# Patient Record
Sex: Male | Born: 1965 | Race: Black or African American | Hispanic: No | Marital: Married | Smoking: Never smoker
Health system: Southern US, Community
[De-identification: ages and names within clinical notes are randomized; demographics above are authoritative.]

## PROBLEM LIST (undated history)

## (undated) DIAGNOSIS — I10 Essential (primary) hypertension: Secondary | ICD-10-CM

## (undated) DIAGNOSIS — M1990 Unspecified osteoarthritis, unspecified site: Secondary | ICD-10-CM

## (undated) DIAGNOSIS — K219 Gastro-esophageal reflux disease without esophagitis: Secondary | ICD-10-CM

## (undated) DIAGNOSIS — Z8489 Family history of other specified conditions: Secondary | ICD-10-CM

## (undated) HISTORY — PX: NO PAST SURGERIES: SHX2092

## (undated) HISTORY — PX: OTHER SURGICAL HISTORY: SHX169

---

## 2004-09-28 ENCOUNTER — Emergency Department: Payer: Self-pay | Admitting: General Practice

## 2009-05-29 ENCOUNTER — Emergency Department: Payer: Self-pay | Admitting: Emergency Medicine

## 2009-09-20 ENCOUNTER — Emergency Department: Payer: Self-pay | Admitting: Unknown Physician Specialty

## 2010-11-02 ENCOUNTER — Emergency Department: Payer: Self-pay | Admitting: Emergency Medicine

## 2011-08-25 ENCOUNTER — Emergency Department: Payer: Self-pay | Admitting: Emergency Medicine

## 2012-03-02 ENCOUNTER — Emergency Department: Payer: Self-pay | Admitting: Emergency Medicine

## 2013-12-20 ENCOUNTER — Emergency Department: Payer: Self-pay | Admitting: Emergency Medicine

## 2013-12-21 LAB — URINALYSIS, COMPLETE
BACTERIA: NONE SEEN
BLOOD: NEGATIVE
Bilirubin,UR: NEGATIVE
GLUCOSE, UR: NEGATIVE mg/dL (ref 0–75)
KETONE: NEGATIVE
Leukocyte Esterase: NEGATIVE
Nitrite: NEGATIVE
PH: 7 (ref 4.5–8.0)
Protein: NEGATIVE
RBC,UR: NONE SEEN /HPF (ref 0–5)
SQUAMOUS EPITHELIAL: NONE SEEN
Specific Gravity: 1.009 (ref 1.003–1.030)
WBC UR: <1 /HPF (ref 0–5)

## 2014-03-23 ENCOUNTER — Emergency Department: Payer: Self-pay | Admitting: Emergency Medicine

## 2014-10-19 ENCOUNTER — Emergency Department: Payer: Self-pay | Admitting: Emergency Medicine

## 2014-11-10 ENCOUNTER — Emergency Department: Payer: Self-pay | Admitting: Emergency Medicine

## 2015-06-02 ENCOUNTER — Emergency Department
Admission: EM | Admit: 2015-06-02 | Discharge: 2015-06-02 | Disposition: A | Discharge status: Home / Self Care | Payer: Self-pay | Attending: Emergency Medicine | Admitting: Emergency Medicine

## 2015-06-02 DIAGNOSIS — Y990 Civilian activity done for income or pay: Secondary | ICD-10-CM | POA: Insufficient documentation

## 2015-06-02 DIAGNOSIS — IMO0001 Reserved for inherently not codable concepts without codable children: Secondary | ICD-10-CM

## 2015-06-02 DIAGNOSIS — X58XXXA Exposure to other specified factors, initial encounter: Secondary | ICD-10-CM | POA: Insufficient documentation

## 2015-06-02 DIAGNOSIS — S66911A Strain of unspecified muscle, fascia and tendon at wrist and hand level, right hand, initial encounter: Secondary | ICD-10-CM | POA: Insufficient documentation

## 2015-06-02 DIAGNOSIS — I10 Essential (primary) hypertension: Secondary | ICD-10-CM | POA: Insufficient documentation

## 2015-06-02 DIAGNOSIS — Y9389 Activity, other specified: Secondary | ICD-10-CM | POA: Insufficient documentation

## 2015-06-02 DIAGNOSIS — Y9289 Other specified places as the place of occurrence of the external cause: Secondary | ICD-10-CM | POA: Insufficient documentation

## 2015-06-02 HISTORY — DX: Essential (primary) hypertension: I10

## 2015-06-02 MED ORDER — MELOXICAM 15 MG PO TABS: 15.0000 mg | ORAL_TABLET | Freq: Every day | ORAL | Status: DC

## 2015-06-02 NOTE — ED Notes (Signed)
REPORTS THAT HE INJURED RIGHT THUMB ONE YEAR AGO AT WORK.  INJURY INCLUDED LIGAMENT INJURY REQUIRING SURGERY TO REPAIR.  RECENTLY RELEASED BACK TO WORK AND FEELS THAT LIGAMENT IS DAMAGED AGAIN.

## 2015-06-02 NOTE — ED Notes (Signed)
Pt is hypertensive in triage,  Pt states that he has a hx of HTN.   He did not take his medications this morning- he states that he ususally takes norvasc and lisinopril

## 2015-06-02 NOTE — Discharge Instructions (Signed)
Cryotherapy °Cryotherapy means treatment with cold. Ice or gel packs can be used to reduce both pain and swelling. Ice is the most helpful within the first 24 to 48 hours after an injury or flare-up from overusing a muscle or joint. Sprains, strains, spasms, burning pain, shooting pain, and aches can all be eased with ice. Ice can also be used when recovering from surgery. Ice is effective, has very few side effects, and is safe for most people to use. °PRECAUTIONS  °Ice is not a safe treatment option for people with: °· Raynaud phenomenon. This is a condition affecting small blood vessels in the extremities. Exposure to cold may cause your problems to return. °· Cold hypersensitivity. There are many forms of cold hypersensitivity, including: °¨ Cold urticaria. Red, itchy hives appear on the skin when the tissues begin to warm after being iced. °¨ Cold erythema. This is a red, itchy rash caused by exposure to cold. °¨ Cold hemoglobinuria. Red blood cells break down when the tissues begin to warm after being iced. The hemoglobin that carry oxygen are passed into the urine because they cannot combine with blood proteins fast enough. °· Numbness or altered sensitivity in the area being iced. °If you have any of the following conditions, do not use ice until you have discussed cryotherapy with your caregiver: °· Heart conditions, such as arrhythmia, angina, or chronic heart disease. °· High blood pressure. °· Healing wounds or open skin in the area being iced. °· Current infections. °· Rheumatoid arthritis. °· Poor circulation. °· Diabetes. °Ice slows the blood flow in the region it is applied. This is beneficial when trying to stop inflamed tissues from spreading irritating chemicals to surrounding tissues. However, if you expose your skin to cold temperatures for too long or without the proper protection, you can damage your skin or nerves. Watch for signs of skin damage due to cold. °HOME CARE INSTRUCTIONS °Follow  these tips to use ice and cold packs safely. °· Place a dry or damp towel between the ice and skin. A damp towel will cool the skin more quickly, so you may need to shorten the time that the ice is used. °· For a more rapid response, add gentle compression to the ice. °· Ice for no more than 10 to 20 minutes at a time. The bonier the area you are icing, the less time it will take to get the benefits of ice. °· Check your skin after 5 minutes to make sure there are no signs of a poor response to cold or skin damage. °· Rest 20 minutes or more between uses. °· Once your skin is numb, you can end your treatment. You can test numbness by very lightly touching your skin. The touch should be so light that you do not see the skin dimple from the pressure of your fingertip. When using ice, most people will feel these normal sensations in this order: cold, burning, aching, and numbness. °· Do not use ice on someone who cannot communicate their responses to pain, such as small children or people with dementia. °HOW TO MAKE AN ICE PACK °Ice packs are the most common way to use ice therapy. Other methods include ice massage, ice baths, and cryosprays. Muscle creams that cause a cold, tingly feeling do not offer the same benefits that ice offers and should not be used as a substitute unless recommended by your caregiver. °To make an ice pack, do one of the following: °· Place crushed ice or a   bag of frozen vegetables in a sealable plastic bag. Squeeze out the excess air. Place this bag inside another plastic bag. Slide the bag into a pillowcase or place a damp towel between your skin and the bag.  Mix 3 parts water with 1 part rubbing alcohol. Freeze the mixture in a sealable plastic bag. When you remove the mixture from the freezer, it will be slushy. Squeeze out the excess air. Place this bag inside another plastic bag. Slide the bag into a pillowcase or place a damp towel between your skin and the bag. SEEK MEDICAL CARE  IF:  You develop white spots on your skin. This may give the skin a blotchy (mottled) appearance.  Your skin turns blue or pale.  Your skin becomes waxy or hard.  Your swelling gets worse. MAKE SURE YOU:   Understand these instructions.  Will watch your condition.  Will get help right away if you are not doing well or get worse. Document Released: 04/27/2011 Document Revised: 01/15/2014 Document Reviewed: 04/27/2011 [facility] Patient Information 2015 [place], [place]. This information is not intended to replace advice given to you by your health care provider. Make sure you discuss any questions you have with your health care provider.  Finger Sprain A finger sprain happens when the bands of tissue that hold the finger bones together (ligaments) stretch too much and tear. HOME CARE  Keep your injured finger raised (elevated) when possible.  Put ice on the injured area, twice a day, for 2 to 3 days.  Put ice in a plastic bag.  Place a towel between your skin and the bag.  Leave the ice on for 15 minutes.  Only take medicine as told by your doctor.  Do not wear rings on the injured finger.  Protect your finger until pain and stiffness go away (usually 3 to 4 weeks).  Do not get your cast or splint to get wet. Cover your cast or splint with a plastic bag when you shower or bathe. Do not swim.  Your doctor may suggest special exercises for you to do. These exercises will help keep or stop stiffness from happening. GET HELP RIGHT AWAY IF:  Your cast or splint gets damaged.  Your pain gets worse, not better. MAKE SURE YOU:  Understand these instructions.  Will watch your condition.  Will get help right away if you are not doing well or get worse. Document Released: 10/03/2010 Document Revised: 11/23/2011 Document Reviewed: 05/04/2011 [facility] Patient Information 2015 [employer], [place]. This information is not intended to replace advice given to you by your health care  provider. Make sure you discuss any questions you have with your health care provider.

## 2015-06-02 NOTE — ED Notes (Signed)
Pt present with c/o pain and burning in right thumb x 2 days,   Hx of ligament repair in the OR at [facility] approx 6 months ago for the same finger.    Pt denies re-injury

## 2015-06-02 NOTE — ED Provider Notes (Signed)
CSN: [id number]     Arrival date & time 06/02/15  1754 History   First MD Initiated Contact with Patient 06/02/15 1900     Chief Complaint  Patient presents with  . Finger Injury     (Consider location/radiation/quality/duration/timing/severity/associated sxs/prior Treatment) HPI  49 year old male presents to the emergency department for evaluation of right thumb pain. Patient has a history of what appears to be on her collateral ligament repair one year ago. Patient states he broke his thumb and then later underwent surgery for ligament repair. Surgery was performed in [place]. Patient denies any recent trauma or injury but notices of last few days he is noticed increased burning pain that is 7 out of 10 in the right thumb. Patient points to the radial collateral ligament as well as the ulnar collateral ligament of the right thumb pain is sharp and increased with activity. He denies any recent trauma or injury. No numbness or tingling.  Past Medical History  Diagnosis Date  . Hypertension    No past surgical history on file. No family history on file. Social History  Substance Use Topics  . Smoking status: Not on file  . Smokeless tobacco: Not on file  . Alcohol Use: Not on file    Review of Systems  Constitutional: Negative.  Negative for fever, chills, activity change and appetite change.  HENT: Negative for congestion, ear pain, mouth sores, rhinorrhea, sinus pressure, sore throat and trouble swallowing.   Eyes: Negative for photophobia, pain and discharge.  Respiratory: Negative for cough, chest tightness and shortness of breath.   Cardiovascular: Negative for chest pain and leg swelling.  Gastrointestinal: Negative for nausea, vomiting, abdominal pain, diarrhea and abdominal distention.  Genitourinary: Negative for dysuria and difficulty urinating.  Musculoskeletal: Positive for arthralgias. Negative for back pain and gait problem.  Skin: Negative for color change and  rash.  Neurological: Negative for dizziness and headaches.  Hematological: Negative for adenopathy.  Psychiatric/Behavioral: Negative for behavioral problems and agitation.      Allergies  Review of patient's allergies indicates no known allergies.  Home Medications   Prior to Admission medications   Medication Sig Start Date End Date Taking? Authorizing Provider  meloxicam (MOBIC) 15 MG tablet Take 1 tablet (15 mg total) by mouth daily. 06/02/15   Duanne Guess, PA-C   BP 192/110 mmHg  Pulse 75  Temp(Src) 98.4 F (36.9 C) (Oral)  Resp 16  Ht 5\' 10"  (1.778 m)  Wt 200 lb (90.719 kg)  BMI 28.70 kg/m2  SpO2 96% Physical Exam  Constitutional: He is oriented to person, place, and time. He appears well-developed and well-nourished.  HENT:  Head: Normocephalic and atraumatic.  Eyes: Conjunctivae and EOM are normal. Pupils are equal, round, and reactive to light.  Neck: Normal range of motion. Neck supple.  Cardiovascular: Normal rate and intact distal pulses.   Pulmonary/Chest: Effort normal. No respiratory distress.  Musculoskeletal:  Examination of the right hand shows the patient has no swelling warmth or erythema. There is incision just over the ulnar collateral ligament of the right thumb. No signs of infection. Patient has mild swelling at the [facility] joint. Negative [facility] grind test. There is no ligamentous laxity with ulnar or radial collateral ligament stress testing at the thumb. Patient has no tendon deficits with flexion or extension of the thumb.  Neurological: He is alert and oriented to person, place, and time.  Skin: Skin is warm and dry.  Psychiatric: He has a normal mood and affect. His  behavior is normal. Judgment and thought content normal.  Nursing note and vitals reviewed.   ED Course  Procedures (including critical care time) SPLINT APPLICATION Date/Time: 1:58 PM Authorized by: Feliberto Gottron Consent: Verbal consent obtained. Risks and benefits:  risks, benefits and alternatives were discussed Consent given by: patient Splint applied by: orthopedic technician Location details: Right hand  Splint type: Ortho-Glass thumb spica  Supplies used: Ace wrap, Ortho-Glass, prewrap  Post-procedure: The splinted body part was neurovascularly unchanged following the procedure. Patient tolerance: Patient tolerated the procedure well with no immediate complications.    Labs Review Labs Reviewed - No data to display  Imaging Review No results found. I have personally reviewed and evaluated these images and lab results as part of my medical decision-making.   EKG Interpretation None      MDM   Final diagnoses:  Strain of thumb, right, initial encounter    49 year old male with acute onset of right thumb pain that is gradually getting worse over the last few days. No signs of infection or gout. He has a history of ulnar collateral ligament repair, repair appears to be stable on exam. He is placed on meloxicam 15 mg daily for 2 weeks. He is given a thumb spica splint to immobilize the thumb joint. He will follow-up with orthopedic surgeon in 5-7 days.    Duanne Guess, PA-C 06/02/15 1912  Earleen Newport, MD 06/02/15 [phone]

## 2015-08-18 ENCOUNTER — Emergency Department: Payer: BLUE CROSS/BLUE SHIELD

## 2015-08-18 ENCOUNTER — Emergency Department
Admission: EM | Admit: 2015-08-18 | Discharge: 2015-08-18 | Disposition: A | Discharge status: Home / Self Care | Payer: BLUE CROSS/BLUE SHIELD | Attending: Emergency Medicine | Admitting: Emergency Medicine

## 2015-08-18 ENCOUNTER — Encounter: Payer: Self-pay | Admitting: Emergency Medicine

## 2015-08-18 DIAGNOSIS — M545 Low back pain, unspecified: Secondary | ICD-10-CM

## 2015-08-18 DIAGNOSIS — I10 Essential (primary) hypertension: Secondary | ICD-10-CM | POA: Insufficient documentation

## 2015-08-18 DIAGNOSIS — Z791 Long term (current) use of non-steroidal anti-inflammatories (NSAID): Secondary | ICD-10-CM | POA: Diagnosis not present

## 2015-08-18 DIAGNOSIS — M25551 Pain in right hip: Secondary | ICD-10-CM | POA: Diagnosis present

## 2015-08-18 MED ORDER — LISINOPRIL 10 MG PO TABS: 10.0000 mg | ORAL_TABLET | Freq: Two times a day (BID) | ORAL | Status: DC

## 2015-08-18 MED ORDER — AMLODIPINE BESYLATE 5 MG PO TABS: 5.0000 mg | ORAL_TABLET | Freq: Every day | ORAL | Status: DC

## 2015-08-18 MED ORDER — PREDNISONE 20 MG PO TABS
60.0000 mg | ORAL_TABLET | Freq: Once | ORAL | Status: AC
  Administered 2015-08-18: 60 mg via ORAL

## 2015-08-18 MED ORDER — PREDNISONE 20 MG PO TABS: ORAL_TABLET | ORAL | Status: DC

## 2015-08-18 MED ORDER — PREDNISONE 20 MG PO TABS
ORAL_TABLET | ORAL | Status: AC
  Administered 2015-08-18: 60 mg via ORAL
  Filled 2015-08-18: qty 3

## 2015-08-18 NOTE — Discharge Instructions (Signed)
Your ultrasound did not show a blood clot. You have the symptoms of a lower back issue with radiculopathy. Take prednisone as prescribed. You may also take ibuprofen if needed. If so, take it with food. Follow-up with a regular doctor for ongoing care. Return to the emergency department if you have urgent concerns.  Back Pain, Adult Back pain is very common. The pain often gets better over time. The cause of back pain is usually not dangerous. Most people can learn to manage their back pain on their own.  HOME CARE  Watch your back pain for any changes. The following actions may help to lessen any pain you are feeling:  Stay active. Start with short walks on flat ground if you can. Try to walk farther each day.  Exercise regularly as told by your doctor. Exercise helps your back heal faster. It also helps avoid future injury by keeping your muscles strong and flexible.  Do not sit, drive, or stand in one place for more than 30 minutes.  Do not stay in bed. Resting more than 1-2 days can slow down your recovery.  Be careful when you bend or lift an object. Use good form when lifting:  Bend at your knees.  Keep the object close to your body.  Do not twist.  Sleep on a firm mattress. Lie on your side, and bend your knees. If you lie on your back, put a pillow under your knees.  Take medicines only as told by your doctor.  Put ice on the injured area.  Put ice in a plastic bag.  Place a towel between your skin and the bag.  Leave the ice on for 20 minutes, 2-3 times a day for the first 2-3 days. After that, you can switch between ice and heat packs.  Avoid feeling anxious or stressed. Find good ways to deal with stress, such as exercise.  Maintain a healthy weight. Extra weight puts stress on your back. GET HELP IF:   You have pain that does not go away with rest or medicine.  You have worsening pain that goes down into your legs or buttocks.  You have pain that does not  get better in one week.  You have pain at night.  You lose weight.  You have a fever or chills. GET HELP RIGHT AWAY IF:   You cannot control when you poop (bowel movement) or pee (urinate).  Your arms or legs feel weak.  Your arms or legs lose feeling (numbness).  You feel sick to your stomach (nauseous) or throw up (vomit).  You have belly (abdominal) pain.  You feel like you may pass out (faint).   This information is not intended to replace advice given to you by your health care provider. Make sure you discuss any questions you have with your health care provider.   Document Released: 02/17/2008 Document Revised: 09/21/2014 Document Reviewed: 01/02/2014 Elsevier Interactive Patient Education [employer].

## 2015-08-18 NOTE — ED Notes (Signed)
Pt in [place] at this time 

## 2015-08-18 NOTE — ED Provider Notes (Signed)
[facility] Emergency Department Provider Note  ____________________________________________  Time seen: 35  I have reviewed the triage vital signs and the nursing notes.  History by:  Patient  HISTORY  Chief Complaint Sciatica  pain radiating from right hip to right knee    HPI Robert Suarez is a 49 y.o. male who presents with discomfort from his right hip to his right knee. He says it feels like it is throbbing sometimes. He has some difficulty with ambulation and increased pain with movement. He reports he may have had symptoms like this once before a few years ago, but is rather vague about this. The symptoms have been present for 3-4 days. He denies any numbness or paresthesia.   After the initial interview and physical exam, the patient tells me that blood clots run in his family. He reports to family members die because of blood clots. He tells me that one family member went to the doctor with a leg issue and was sent away. He appears anxious and nervous about this possibility for a DVT.    Past Medical History  Diagnosis Date  . Hypertension     There are no active problems to display for this patient.   History reviewed. No pertinent past surgical history.  Current Outpatient Rx  Name  Route  Sig  Dispense  Refill  . amLODipine (NORVASC) 5 MG tablet   Oral   Take 1 tablet (5 mg total) by mouth daily.   30 tablet   1   . lisinopril (PRINIVIL,ZESTRIL) 10 MG tablet   Oral   Take 1 tablet (10 mg total) by mouth 2 (two) times daily.   30 tablet   1   . meloxicam (MOBIC) 15 MG tablet   Oral   Take 1 tablet (15 mg total) by mouth daily.   14 tablet   0   . predniSONE (DELTASONE) 20 MG tablet      Take 2 tablets on the first and second day. Take 1 tablet on the third through sixth day.   7 tablet   0     Allergies Review of patient's allergies indicates no known allergies.  History reviewed. No pertinent family  history.  Social History Social History  Substance Use Topics  . Smoking status: Never Smoker   . Smokeless tobacco: Never Used  . Alcohol Use: No    Review of Systems  Constitutional: Negative for fever/chills. ENT: Negative for congestion. Cardiovascular: Negative for chest pain. Respiratory: Negative for cough. Gastrointestinal: Negative for abdominal pain, vomiting and diarrhea. Genitourinary: Negative for dysuria. Musculoskeletal: Discomfort from right hip to right knee. See history of present illness Skin: Negative for rash. Neurological: Negative for headache or focal weakness   10-point ROS otherwise negative.  ____________________________________________   PHYSICAL EXAM:  VITAL SIGNS: ED Triage Vitals  Enc Vitals Group     BP 08/18/15 0638 149/98 mmHg     Pulse Rate 08/18/15 0638 72     Resp 08/18/15 0638 18     Temp 08/18/15 0638 98.3 F (36.8 C)     Temp Source 08/18/15 0638 Oral     SpO2 08/18/15 0638 96 %     Weight 08/18/15 0638 200 lb (90.719 kg)     Height 08/18/15 0638 5\' 10"  (1.778 m)     Head Cir --      Peak Flow --      Pain Score 08/18/15 0639 7     Pain Loc --  Pain Edu? --      Excl. in [place]? --     Constitutional:  Alert and oriented. Well appearing and in no distress. Some discomfort with ambulation. ENT   Head: Normocephalic and atraumatic.   Nose: No congestion/rhinnorhea.  Cardiovascular: Normal rate, regular rhythm, no murmur noted Respiratory:  Normal respiratory effort, no tachypnea.    Breath sounds are clear and equal bilaterally.  Gastrointestinal: Soft, no distention. Nontender Back: No muscle spasm, no tenderness, no CVA tenderness. Patient has some discomfort in the right leg with straight leg raise. Musculoskeletal: No deformity noted. Nontender with normal range of motion in all extremities.  No noted edema. Calves are equal in size. Pulses present bilaterally. Neurologic:  Communicative.  No gross focal  neurologic deficits are appreciated.  Intact sensation in all extremities. Skin:  Skin is warm, dry. No rash noted. Psychiatric: Mood and affect are normal. Speech and behavior are normal.  ____________________________________________     RADIOLOGY  Doppler ultrasound right leg:   IMPRESSION: No evidence of right lower extremity deep venous thrombosis. Superficial soft tissues of the right lower extremity are unremarkable.   ____________________________________________  ____________________________________________   INITIAL IMPRESSION / ASSESSMENT AND PLAN / ED COURSE  Pertinent labs & imaging results that were available during my care of the patient were reviewed by me and considered in my medical decision making (see chart for details).  Overall well-appearing 49 year old male with some discomfort with ambulation with signs and symptoms that are consistent with a back issue possible nerve impingement. I believe the appropriate treatment for him is the use of steroids to reduce inflammation and to have him reassessed as an outpatient. The patient had not mentioned anything about a family history of DVTs initially. Clinically he does not appear to have a DVT. Catheter are of equal size. The symptoms are consistent with a nerve impingement from above. Given his anxiety and family history we will obtain a Doppler ultrasound to evaluate for low likely DVT.  ----------------------------------------- 10:13 AM on 08/18/2015 -----------------------------------------  It took a while to get the DVT completed, but it has returned and is negative.  I will discharge patient with a prednisone taper as initially planned. I've asked him to follow-up with his primary physician.  ____________________________________________   FINAL CLINICAL IMPRESSION(S) / ED DIAGNOSES  Final diagnoses:  Right-sided low back pain without sciatica      Ahmed Prima, MD 08/18/15 1019

## 2015-08-18 NOTE — ED Notes (Signed)
Pt c/o pain that radiates from right buttock down the back of his leg to about his knee; pt denies history of similar pain; ambulatory without difficulty;

## 2015-08-18 NOTE — ED Notes (Signed)
NAD noted at time of D/C. Pt denies questions or concerns. Pt ambulatory to the lobby at this time. MD made aware of pt's BP. This RN instructed patient to take BP medication when he got home as he had not taken his dose today.

## 2016-02-13 ENCOUNTER — Other Ambulatory Visit: Payer: Self-pay | Admitting: Student

## 2016-02-13 DIAGNOSIS — M25561 Pain in right knee: Secondary | ICD-10-CM

## 2016-03-06 ENCOUNTER — Ambulatory Visit: Payer: BLUE CROSS/BLUE SHIELD

## 2016-05-11 ENCOUNTER — Other Ambulatory Visit: Payer: Self-pay

## 2016-05-11 ENCOUNTER — Telehealth: Payer: Self-pay

## 2016-05-11 NOTE — Telephone Encounter (Signed)
Gastroenterology Pre-Procedure Review  Request Date: 06/11/2016 Requesting Physician:   PATIENT REVIEW QUESTIONS: The patient responded to the following health history questions as indicated:    1. Are you having any GI issues? no 2. Do you have a personal history of Polyps? no 3. Do you have a family history of Colon Cancer or Polyps? yes (father polyps, benign) 4. Diabetes Mellitus? no 5. Joint replacements in the past 12 months?no 6. Major health problems in the past 3 months?no 7. Any artificial heart valves, MVP, or defibrillator?no    MEDICATIONS & ALLERGIES:    Patient reports the following regarding taking any anticoagulation/antiplatelet therapy:   Plavix, Coumadin, Eliquis, Xarelto, Lovenox, Pradaxa, Brilinta, or Effient? no Aspirin? no  Patient confirms/reports the following medications:  Current Outpatient Prescriptions  Medication Sig Dispense Refill  . amLODipine (NORVASC) 5 MG tablet Take 1 tablet (5 mg total) by mouth daily. 30 tablet 1  . lisinopril (PRINIVIL,ZESTRIL) 10 MG tablet Take 1 tablet (10 mg total) by mouth 2 (two) times daily. 30 tablet 1   No current facility-administered medications for this visit.     Patient confirms/reports the following allergies:  No Known Allergies  No orders of the defined types were placed in this encounter.   AUTHORIZATION INFORMATION Primary Insurance: 1D#: Group #:  Secondary Insurance: 1D#: Group #:  SCHEDULE INFORMATION: Date: 06/11/2016 Time: Location: [facility]

## 2016-05-11 NOTE — Telephone Encounter (Signed)
Screening Colonoscopy Z12.11 [facility] [id number] Please pre cert

## 2016-05-28 ENCOUNTER — Encounter: Payer: Self-pay | Admitting: *Deleted

## 2016-05-29 ENCOUNTER — Encounter: Payer: Self-pay | Admitting: Student in an Organized Health Care Education/Training Program

## 2016-05-29 ENCOUNTER — Other Ambulatory Visit: Payer: Self-pay

## 2016-05-29 DIAGNOSIS — I10 Essential (primary) hypertension: Secondary | ICD-10-CM | POA: Insufficient documentation

## 2016-06-02 ENCOUNTER — Other Ambulatory Visit: Payer: Self-pay

## 2016-06-02 ENCOUNTER — Telehealth: Payer: Self-pay

## 2016-06-03 NOTE — Telephone Encounter (Signed)
Pt had to move colonoscopy appt to Nov 7th.

## 2016-06-04 ENCOUNTER — Ambulatory Visit
Admission: RE | Admit: 2016-06-04 | Payer: BLUE CROSS/BLUE SHIELD | Source: Ambulatory Visit | Admitting: Gastroenterology

## 2016-06-04 HISTORY — DX: Family history of other specified conditions: Z84.89

## 2016-06-04 SURGERY — COLONOSCOPY WITH PROPOFOL
Anesthesia: General

## 2016-07-20 ENCOUNTER — Telehealth: Payer: Self-pay | Admitting: Gastroenterology

## 2016-07-20 NOTE — Telephone Encounter (Signed)
Patient calling back to reschedule his colonoscopy

## 2016-07-21 ENCOUNTER — Ambulatory Visit
Admission: RE | Admit: 2016-07-21 | Payer: BLUE CROSS/BLUE SHIELD | Source: Ambulatory Visit | Admitting: Gastroenterology

## 2016-07-21 ENCOUNTER — Other Ambulatory Visit: Payer: Self-pay

## 2016-07-21 ENCOUNTER — Encounter: Admission: RE | Payer: Self-pay | Source: Ambulatory Visit

## 2016-07-21 SURGERY — COLONOSCOPY WITH PROPOFOL
Anesthesia: General

## 2016-07-21 NOTE — Telephone Encounter (Signed)
Rescheduled for 07/30/16 [facility] [address] Dr. Vicente Males

## 2016-07-21 NOTE — Telephone Encounter (Signed)
Tried calling patient. Voicemail has not been set up yet. No message was left

## 2016-07-30 ENCOUNTER — Encounter
Admission: RE | Disposition: A | Discharge status: Home / Self Care | Payer: Self-pay | Source: Ambulatory Visit | Attending: Gastroenterology

## 2016-07-30 ENCOUNTER — Ambulatory Visit
Admission: RE | Admit: 2016-07-30 | Discharge: 2016-07-30 | Disposition: A | Discharge status: Home / Self Care | Payer: BLUE CROSS/BLUE SHIELD | Source: Ambulatory Visit | Attending: Gastroenterology | Admitting: Gastroenterology

## 2016-07-30 ENCOUNTER — Ambulatory Visit: Payer: BLUE CROSS/BLUE SHIELD | Admitting: Anesthesiology

## 2016-07-30 DIAGNOSIS — D127 Benign neoplasm of rectosigmoid junction: Secondary | ICD-10-CM | POA: Diagnosis not present

## 2016-07-30 DIAGNOSIS — E119 Type 2 diabetes mellitus without complications: Secondary | ICD-10-CM | POA: Insufficient documentation

## 2016-07-30 DIAGNOSIS — K621 Rectal polyp: Secondary | ICD-10-CM | POA: Diagnosis not present

## 2016-07-30 DIAGNOSIS — Z79899 Other long term (current) drug therapy: Secondary | ICD-10-CM | POA: Insufficient documentation

## 2016-07-30 DIAGNOSIS — K640 First degree hemorrhoids: Secondary | ICD-10-CM | POA: Insufficient documentation

## 2016-07-30 DIAGNOSIS — I10 Essential (primary) hypertension: Secondary | ICD-10-CM | POA: Diagnosis not present

## 2016-07-30 DIAGNOSIS — M1990 Unspecified osteoarthritis, unspecified site: Secondary | ICD-10-CM | POA: Diagnosis not present

## 2016-07-30 DIAGNOSIS — Z1211 Encounter for screening for malignant neoplasm of colon: Secondary | ICD-10-CM | POA: Diagnosis not present

## 2016-07-30 HISTORY — DX: Unspecified osteoarthritis, unspecified site: M19.90

## 2016-07-30 HISTORY — PX: COLONOSCOPY WITH PROPOFOL: SHX5780

## 2016-07-30 SURGERY — COLONOSCOPY WITH PROPOFOL
Anesthesia: General

## 2016-07-30 MED ORDER — SODIUM CHLORIDE 0.9 % IV SOLN
INTRAVENOUS | Status: DC | PRN
  Administered 2016-07-30: 11:00:00 via INTRAVENOUS

## 2016-07-30 MED ORDER — MIDAZOLAM HCL 2 MG/2ML IJ SOLN
INTRAMUSCULAR | Status: DC | PRN
  Administered 2016-07-30: 2 mg via INTRAVENOUS

## 2016-07-30 MED ORDER — LIDOCAINE HCL (CARDIAC) 20 MG/ML IV SOLN
INTRAVENOUS | Status: DC | PRN
  Administered 2016-07-30: 80 mg via INTRAVENOUS

## 2016-07-30 MED ORDER — PROPOFOL 500 MG/50ML IV EMUL
INTRAVENOUS | Status: DC | PRN
  Administered 2016-07-30: 150 ug/kg/min via INTRAVENOUS

## 2016-07-30 MED ORDER — PROPOFOL 10 MG/ML IV BOLUS
INTRAVENOUS | Status: DC | PRN
  Administered 2016-07-30: 80 mg via INTRAVENOUS
  Administered 2016-07-30: 20 mg via INTRAVENOUS

## 2016-07-30 NOTE — Op Note (Signed)
[facility] Gastroenterology Patient Name: Robert Suarez Procedure Date: 07/30/2016 12:03 PM MRN: [id number] Account #: [id number] Date of Birth: [birth year] Admit Type: Outpatient Age: 50 Room: [facility] ENDO ROOM 4 Gender: Male Note Status: Finalized Procedure:            Colonoscopy Indications:          Screening for colorectal malignant neoplasm Providers:            Jonathon Bellows MD, MD Referring MD:         No Local Md, MD (Referring MD) Medicines:            Monitored Anesthesia Care Complications:        No immediate complications. Procedure:            Pre-Anesthesia Assessment:                       - ASA Grade Assessment: II - A patient with mild                        systemic disease.                       - Prior to the procedure, a History and Physical was                        performed, and patient medications, allergies and                        sensitivities were reviewed. The patient's tolerance of                        previous anesthesia was reviewed.                       - The risks and benefits of the procedure and the                        sedation options and risks were discussed with the                        patient. All questions were answered and informed                        consent was obtained.                       After obtaining informed consent, the colonoscope was                        passed under direct vision. Throughout the procedure,                        the patient's blood pressure, pulse, and oxygen                        saturations were monitored continuously. The                        Colonoscope was introduced through the anus and  advanced to the the cecum, identified by the                        appendiceal orifice, IC valve and transillumination.                        The colonoscopy was performed with ease. The patient                        tolerated the procedure well. The quality  of the bowel                        preparation was excellent. Findings:      The perianal and digital rectal examinations were normal.      Non-bleeding internal hemorrhoids were found during retroflexion. The       hemorrhoids were small and Grade I (internal hemorrhoids that do not       prolapse).      A 3 mm polyp was found in the recto-sigmoid colon. The polyp was       sessile. The polyp was removed with a cold biopsy forceps. Resection and       retrieval were complete. Impression:           - Non-bleeding internal hemorrhoids.                       - One 3 mm polyp at the recto-sigmoid colon, removed                        with a cold biopsy forceps. Resected and retrieved. Recommendation:       - Patient has a contact number available for                        emergencies. The signs and symptoms of potential                        delayed complications were discussed with the patient.                        Return to normal activities tomorrow. Written discharge                        instructions were provided to the patient.                       - Resume previous diet.                       - Await pathology results.                       - Repeat colonoscopy for surveillance based on                        pathology results.                       - Discharge patient to home (with escort). Procedure Code(s):    --- Professional ---  45380, Colonoscopy, flexible; with biopsy, single or                        multiple Diagnosis Code(s):    --- Professional ---                       Z12.11, Encounter for screening for malignant neoplasm                        of colon                       D12.7, Benign neoplasm of rectosigmoid junction                       K64.0, First degree hemorrhoids CPT copyright 2016 American Medical Association. All rights reserved. The codes documented in this report are preliminary and upon coder review may  be revised to  meet current compliance requirements. Jonathon Bellows, MD Jonathon Bellows MD, MD 07/30/2016 12:26:15 PM This report has been signed electronically. Number of Addenda: 0 Note Initiated On: 07/30/2016 12:03 PM Scope Withdrawal Time: 0 hours 14 minutes 43 seconds  Total Procedure Duration: 0 hours 16 minutes 57 seconds       [facility]

## 2016-07-30 NOTE — H&P (Signed)
  Jonathon Bellows MD [address], [place], [place] [address] Phone: [phone] Fax : [phone]  Primary Care Physician:  No PCP Per Patient Primary Gastroenterologist:  Dr. Jonathon Bellows   Pre-Procedure History & Physical: HPI:  Robert Suarez is a 50 y.o. male is here for an colonoscopy.   Past Medical History:  Diagnosis Date  . Arthritis    right knee  . Family history of adverse reaction to anesthesia    son dx'd with malignant hyperthermia 3 yrs ago (after jaw surgery)  . Hypertension     Past Surgical History:  Procedure Laterality Date  . ligament repair Right   . NO PAST SURGERIES      Prior to Admission medications   Medication Sig Start Date End Date Taking? Authorizing Provider  amLODipine (NORVASC) 5 MG tablet Take 1 tablet (5 mg total) by mouth daily. 08/18/15   Ahmed Prima, MD  etodolac (LODINE) 500 MG tablet Take by mouth. 10/02/15   Historical Provider, MD  lisinopril (PRINIVIL,ZESTRIL) 10 MG tablet Take 1 tablet (10 mg total) by mouth 2 (two) times daily. 08/18/15   Ahmed Prima, MD  predniSONE (STERAPRED UNI-PAK 21 TAB) 10 MG (21) TBPK tablet 6 day taper. Take as directed with food 01/21/16   Historical Provider, MD    Allergies as of 07/21/2016  . (No Known Allergies)    History reviewed. No pertinent family history.  Social History   Social History  . Marital status: Married    Spouse name: N/A  . Number of children: N/A  . Years of education: N/A   Occupational History  . Not on file.   Social History Main Topics  . Smoking status: Never Smoker  . Smokeless tobacco: Never Used  . Alcohol use No  . Drug use: No  . Sexual activity: Not on file   Other Topics Concern  . Not on file   Social History Narrative  . No narrative on file    Review of Systems: See HPI, otherwise negative ROS  Physical Exam: BP (!) 141/92   Pulse 79   Temp 98.7 F (37.1 C) (Oral)   Resp 16   Ht 5\' 10"  (1.778 m)   Wt 200 lb (90.7 kg)   SpO2  99%   BMI 28.70 kg/m  General:   Alert,  pleasant and cooperative in NAD Head:  Normocephalic and atraumatic. Neck:  Supple; no masses or thyromegaly. Lungs:  Clear throughout to auscultation.    Heart:  Regular rate and rhythm. Abdomen:  Soft, nontender and nondistended. Normal bowel sounds, without guarding, and without rebound.   Neurologic:  Alert and  oriented x4;  grossly normal neurologically.  Impression/Plan: Robert Suarez is here for an colonoscopy to be performed for colorectal cancer screening   Risks, benefits, limitations, and alternatives regarding  colonoscopy have been reviewed with the patient.  Questions have been answered.  All parties agreeable.   Jonathon Bellows, MD  07/30/2016, 11:31 AM

## 2016-07-30 NOTE — Anesthesia Postprocedure Evaluation (Signed)
Anesthesia Post Note  Patient: Robert Suarez  Procedure(s) Performed: Procedure(s) (LRB): COLONOSCOPY WITH PROPOFOL (N/A)  Patient location during evaluation: Endoscopy Anesthesia Type: General Level of consciousness: awake and alert Pain management: pain level controlled Vital Signs Assessment: post-procedure vital signs reviewed and stable Respiratory status: spontaneous breathing, nonlabored ventilation and respiratory function stable Cardiovascular status: blood pressure returned to baseline and stable Postop Assessment: no signs of nausea or vomiting Anesthetic complications: no    Last Vitals:  Vitals:   07/30/16 1048 07/30/16 1227  BP: (!) 141/92 117/87  Pulse: 79 81  Resp: 16 18  Temp: 37.1 C 36.1 C    Last Pain:  Vitals:   07/30/16 1227  TempSrc: Tympanic                 Faisal Stradling

## 2016-07-30 NOTE — Transfer of Care (Signed)
Immediate Anesthesia Transfer of Care Note  Patient: Tatsuya Marana  Procedure(s) Performed: Procedure(s): COLONOSCOPY WITH PROPOFOL (N/A)  Patient Location: PACU  Anesthesia Type:General  Level of Consciousness: sedated  Airway & Oxygen Therapy: Patient Spontanous Breathing and Patient connected to nasal cannula oxygen  Post-op Assessment: Report given to RN and Post -op Vital signs reviewed and stable  Post vital signs: Reviewed and stable  Last Vitals:  Vitals:   07/30/16 1048 07/30/16 1227  BP: (!) 141/92 117/87  Pulse: 79 81  Resp: 16 18  Temp: 37.1 C     Last Pain:  Vitals:   07/30/16 1048  TempSrc: Oral         Complications: No apparent anesthesia complications

## 2016-07-30 NOTE — Anesthesia Preprocedure Evaluation (Signed)
Anesthesia Evaluation  Patient identified by MRN, date of birth, ID band Patient awake    Reviewed: Allergy & Precautions, NPO status , Patient's Chart, lab work & pertinent test results  History of Anesthesia Complications (+) Family history of anesthesia reactionHistory of anesthetic complications: son has malignant hyperthermia, patient has never had anesthesia and has not been tested.  Airway Mallampati: II  TM Distance: >3 FB Neck ROM: Full    Dental no notable dental hx.    Pulmonary neg pulmonary ROS, neg sleep apnea, neg COPD,    breath sounds clear to auscultation- rhonchi (-) wheezing      Cardiovascular hypertension, Pt. on medications (-) CAD and (-) Past MI  Rhythm:Regular Rate:Normal - Systolic murmurs and - Diastolic murmurs    Neuro/Psych negative neurological ROS  negative psych ROS   GI/Hepatic negative GI ROS, Neg liver ROS,   Endo/Other  negative endocrine ROSneg diabetes  Renal/GU negative Renal ROS     Musculoskeletal  (+) Arthritis ,   Abdominal (+) - obese,   Peds  Hematology negative hematology ROS (+)   Anesthesia Other Findings Past Medical History: No date: Arthritis     Comment: right knee No date: Family history of adverse reaction to anesthes*     Comment: son dx'd with malignant hyperthermia 3 yrs ago              (after jaw surgery) No date: Hypertension   Reproductive/Obstetrics                             Anesthesia Physical Anesthesia Plan  ASA: II  Anesthesia Plan: General   Post-op Pain Management:    Induction: Intravenous  Airway Management Planned: Natural Airway  Additional Equipment:   Intra-op Plan:   Post-operative Plan:   Informed Consent: I have reviewed the patients History and Physical, chart, labs and discussed the procedure including the risks, benefits and alternatives for the proposed anesthesia with the patient or  authorized representative who has indicated his/her understanding and acceptance.   Dental advisory given  Plan Discussed with: CRNA and Anesthesiologist  Anesthesia Plan Comments:         Anesthesia Quick Evaluation

## 2016-07-30 NOTE — Anesthesia Procedure Notes (Signed)
Performed by: Camilo Mander Pre-anesthesia Checklist: Patient identified, Emergency Drugs available, Suction available, Patient being monitored and Timeout performed Patient Re-evaluated:Patient Re-evaluated prior to inductionOxygen Delivery Method: Nasal cannula Intubation Type: IV induction       

## 2016-07-31 LAB — SURGICAL PATHOLOGY

## 2016-08-03 ENCOUNTER — Telehealth: Payer: Self-pay | Admitting: Gastroenterology

## 2016-08-03 NOTE — Telephone Encounter (Signed)
Pt was wondering what he is supposed to do next. He had a colonoscopy on 07/30/16. Please advise

## 2016-08-04 NOTE — Telephone Encounter (Signed)
Advised pt results are available but needs review. Will contact once Dr. Vicente Males advises.

## 2016-08-05 NOTE — Telephone Encounter (Signed)
Patient called upset because no one has contacted him pertaining to his colonoscopy report. Patient had the colonoscopy done on 07/30/2016. Ginger documented yesterday that we need to have doctor Vicente Males review over the results and then advice his nurse on what needs to be done next. Patient does not understand this. I explained to the patient that the results need to be reviewed by a physician. A nurse is not able to review and then treat the patient on their own means. The patient got really angry and began to talk about not having time to wait around for the doctor. I apologized for that but once again explained that there is nothing I can do until the doctor reviews over his results. Patient then responded with "if the nurse contacts me, then she contacts me. If not, then not, bye" and hung up the phone.

## 2016-08-13 ENCOUNTER — Telehealth: Payer: Self-pay

## 2016-08-13 NOTE — Telephone Encounter (Signed)
-----   Message from Jonathon Bellows, MD sent at 08/10/2016  3:10 PM EST ----- Repeat in 10 years colonoscopy

## 2016-08-13 NOTE — Telephone Encounter (Signed)
Pt's wife notified of colonoscopy results.

## 2017-01-20 ENCOUNTER — Emergency Department: Payer: BLUE CROSS/BLUE SHIELD

## 2017-01-20 ENCOUNTER — Encounter: Payer: Self-pay | Admitting: Emergency Medicine

## 2017-01-20 ENCOUNTER — Emergency Department
Admission: EM | Admit: 2017-01-20 | Discharge: 2017-01-20 | Disposition: A | Discharge status: Home / Self Care | Payer: BLUE CROSS/BLUE SHIELD | Attending: Emergency Medicine | Admitting: Emergency Medicine

## 2017-01-20 DIAGNOSIS — M79602 Pain in left arm: Secondary | ICD-10-CM | POA: Insufficient documentation

## 2017-01-20 DIAGNOSIS — Z760 Encounter for issue of repeat prescription: Secondary | ICD-10-CM | POA: Diagnosis not present

## 2017-01-20 DIAGNOSIS — I10 Essential (primary) hypertension: Secondary | ICD-10-CM | POA: Insufficient documentation

## 2017-01-20 DIAGNOSIS — Z79899 Other long term (current) drug therapy: Secondary | ICD-10-CM | POA: Insufficient documentation

## 2017-01-20 MED ORDER — LISINOPRIL 10 MG PO TABS: 10.0000 mg | ORAL_TABLET | Freq: Every day | ORAL | 11 refills | Status: DC

## 2017-01-20 MED ORDER — MELOXICAM 15 MG PO TABS: 15.0000 mg | ORAL_TABLET | Freq: Every day | ORAL | 2 refills | Status: DC

## 2017-01-20 MED ORDER — AMLODIPINE BESYLATE 5 MG PO TABS: 5.0000 mg | ORAL_TABLET | Freq: Every day | ORAL | 11 refills | Status: DC

## 2017-01-20 NOTE — ED Triage Notes (Signed)
Pt to ed with c/o left arm pain worse with movement and palpation since yesterday. Pt denies new injury, but pt with noted decreased ROM in left shoulder and left arm. Pt denies chest pain, denies sob, denies neck pain, denies back pain.

## 2017-01-20 NOTE — ED Provider Notes (Signed)
[facility] Emergency Department Provider Note   ____________________________________________   First MD Initiated Contact with Patient 01/20/17 1434     (approximate)  I have reviewed the triage vital signs and the nursing notes.   HISTORY  Chief Complaint Arm Pain   HPI Robert Suarez is a 51 y.o. male is complaining of left shoulder and left elbow pain increased with movement. Patient states gradual onset over month worsened just today. Patient denies any injury. Patient stated pain increases with abduction and opiate reaching of the shoulder. Patient to elbow pain increases with supination of the extremity. Patient denies loss of sensation or loss of strength of the upper extremity. Patient rates pain as a 7/10. Patient described a pain as "achy". No palliative measures for complaint. Patient state no blood pressure medicine 2 weeks. Patient state has appointment with PCP if the month.   Past Medical History:  Diagnosis Date  . Arthritis    right knee  . Family history of adverse reaction to anesthesia    son dx'd with malignant hyperthermia 3 yrs ago (after jaw surgery)  . Hypertension     Patient Active Problem List   Diagnosis Date Noted  . HTN (hypertension) 05/29/2016    Past Surgical History:  Procedure Laterality Date  . COLONOSCOPY WITH PROPOFOL N/A 07/30/2016   Procedure: COLONOSCOPY WITH PROPOFOL;  Surgeon: Jonathon Bellows, MD;  Location: [facility] ENDOSCOPY;  Service: Endoscopy;  Laterality: N/A;  . ligament repair Right   . NO PAST SURGERIES      Prior to Admission medications   Medication Sig Start Date End Date Taking? Authorizing Provider  amLODipine (NORVASC) 5 MG tablet Take 1 tablet (5 mg total) by mouth daily. 08/18/15   Ahmed Prima, MD  amLODipine (NORVASC) 5 MG tablet Take 1 tablet (5 mg total) by mouth daily. 01/20/17 01/20/18  Sable Feil, PA-C  etodolac (LODINE) 500 MG tablet Take by mouth. 10/02/15   [provider]  lisinopril (PRINIVIL,ZESTRIL) 10 MG tablet Take 1 tablet (10 mg total) by mouth 2 (two) times daily. 08/18/15   Ahmed Prima, MD  lisinopril (PRINIVIL,ZESTRIL) 10 MG tablet Take 1 tablet (10 mg total) by mouth daily. 01/20/17 01/20/18  Sable Feil, PA-C  meloxicam (MOBIC) 15 MG tablet Take 1 tablet (15 mg total) by mouth daily. 01/20/17   Sable Feil, PA-C  predniSONE (STERAPRED UNI-PAK 21 TAB) 10 MG (21) TBPK tablet 6 day taper. Take as directed with food 01/21/16   [provider]    Allergies Patient has no known allergies.  No family history on file.  Social History Social History  Substance Use Topics  . Smoking status: Never Smoker  . Smokeless tobacco: Never Used  . Alcohol use No    Review of Systems  Constitutional: No fever/chills Eyes: No visual changes. ENT: No sore throat. Cardiovascular: Denies chest pain. Respiratory: Denies shortness of breath. Gastrointestinal: No abdominal pain.  No nausea, no vomiting.  No diarrhea.  No constipation. Genitourinary: Negative for dysuria. Musculoskeletal: Negative for back pain. Skin: Negative for rash. Neurological: Negative for headaches, focal weakness or numbness. Endocrine:Hypertension  ____________________________________________   PHYSICAL EXAM:  VITAL SIGNS: ED Triage Vitals [01/20/17 1402]  Enc Vitals Group     BP (!) 161/100     Pulse Rate 81     Resp 20     Temp 97.2 F (36.2 C)     Temp Source Oral     SpO2 99 %  Weight 200 lb (90.7 kg)     Height      Head Circumference      Peak Flow      Pain Score 7     Pain Loc      Pain Edu?      Excl. in [place]?     Constitutional: Alert and oriented. Well appearing and in no acute distress. Eyes: Conjunctivae are normal. PERRL. EOMI. Head: Atraumatic. Nose: No congestion/rhinnorhea. Mouth/Throat: Mucous membranes are moist.  Oropharynx non-erythematous. Neck: No stridor.  No cervical spine tenderness to  palpation. Hematological/Lymphatic/Immunilogical: No cervical lymphadenopathy. Cardiovascular: Normal rate, regular rhythm. Grossly normal heart sounds.  Good peripheral circulation. We'll get a blood pressure Respiratory: Normal respiratory effort.  No retractions. Lungs CTAB. Gastrointestinal: Soft and nontender. No distention. No abdominal bruits. No CVA tenderness. Musculoskeletal: No obvious deformity to the left upper extremity.  Neurologic:  Normal speech and language. No gross focal neurologic deficits are appreciated. No gait instability. Skin:  Skin is warm, dry and intact. No rash noted. Psychiatric: Mood and affect are normal. Speech and behavior are normal.  ____________________________________________   LABS (all labs ordered are listed, but only abnormal results are displayed)  Labs Reviewed - No data to display ____________________________________________  EKG   ____________________________________________  RADIOLOGY  No acute findings on x-ray. ____________________________________________   PROCEDURES  Procedure(s) performed: None  Procedures  Critical Care performed: No  ____________________________________________   INITIAL IMPRESSION / ASSESSMENT AND PLAN / ED COURSE  Pertinent labs & imaging results that were available during my care of the patient were reviewed by me and considered in my medical decision making (see chart for details).  Arthralgia secondary to early arthritis. Medication refill for hypertension.     ____________________________________________   FINAL CLINICAL IMPRESSION(S) / ED DIAGNOSES  Final diagnoses:  Left arm pain  Encounter for medication refill      NEW MEDICATIONS STARTED DURING THIS VISIT:  New Prescriptions   AMLODIPINE (NORVASC) 5 MG TABLET    Take 1 tablet (5 mg total) by mouth daily.   LISINOPRIL (PRINIVIL,ZESTRIL) 10 MG TABLET    Take 1 tablet (10 mg total) by mouth daily.   MELOXICAM (MOBIC) 15  MG TABLET    Take 1 tablet (15 mg total) by mouth daily.     Note:  This document was prepared using Dragon voice recognition software and may include unintentional dictation errors.    Sable Feil, PA-C 01/20/17 1538    Earleen Newport, MD 01/21/17 [phone]

## 2017-01-20 NOTE — ED Notes (Addendum)
Pt reports that he is having left arm pain - he states when he woke up his arm was feeling weak and that this has been getting worse over the last week - pt states he has also been voiding frequently

## 2018-01-17 ENCOUNTER — Emergency Department
Admission: EM | Admit: 2018-01-17 | Discharge: 2018-01-17 | Disposition: A | Discharge status: Home / Self Care | Payer: BLUE CROSS/BLUE SHIELD | Attending: Emergency Medicine | Admitting: Emergency Medicine

## 2018-01-17 ENCOUNTER — Other Ambulatory Visit: Payer: Self-pay

## 2018-01-17 DIAGNOSIS — Z9114 Patient's other noncompliance with medication regimen: Secondary | ICD-10-CM | POA: Diagnosis not present

## 2018-01-17 DIAGNOSIS — I10 Essential (primary) hypertension: Secondary | ICD-10-CM | POA: Diagnosis not present

## 2018-01-17 DIAGNOSIS — Z9119 Patient's noncompliance with other medical treatment and regimen: Secondary | ICD-10-CM

## 2018-01-17 DIAGNOSIS — J069 Acute upper respiratory infection, unspecified: Secondary | ICD-10-CM

## 2018-01-17 DIAGNOSIS — Z79899 Other long term (current) drug therapy: Secondary | ICD-10-CM | POA: Diagnosis not present

## 2018-01-17 DIAGNOSIS — Z91199 Patient's noncompliance with other medical treatment and regimen due to unspecified reason: Secondary | ICD-10-CM

## 2018-01-17 DIAGNOSIS — J029 Acute pharyngitis, unspecified: Secondary | ICD-10-CM | POA: Diagnosis present

## 2018-01-17 LAB — GROUP A STREP BY PCR: Group A Strep by PCR: NOT DETECTED

## 2018-01-17 MED ORDER — AMLODIPINE BESYLATE 5 MG PO TABS: 5.0000 mg | ORAL_TABLET | Freq: Every day | ORAL | 1 refills | Status: DC

## 2018-01-17 MED ORDER — CETIRIZINE-PSEUDOEPHEDRINE ER 5-120 MG PO TB12: 1.0000 | ORAL_TABLET | Freq: Two times a day (BID) | ORAL | 0 refills | Status: DC

## 2018-01-17 MED ORDER — LISINOPRIL 10 MG PO TABS: 10.0000 mg | ORAL_TABLET | Freq: Two times a day (BID) | ORAL | 1 refills | Status: DC

## 2018-01-17 MED ORDER — PSEUDOEPH-BROMPHEN-DM 30-2-10 MG/5ML PO SYRP: 5.0000 mL | ORAL_SOLUTION | Freq: Four times a day (QID) | ORAL | 0 refills | Status: DC | PRN

## 2018-01-17 NOTE — Discharge Instructions (Signed)
You need to call make an appointment for complete physical and for further care to include your hypertension at 1 of the clinics listed above.  Call today to make an appointment as your appointment might be 1 month out.  There are multiple clinics listed on your discharge papers for you to call.  You will need constant management of your hypertension as well as a complete physical.  You may also obtain Flonase nasal spray over-the-counter for your symptoms.

## 2018-01-17 NOTE — ED Notes (Signed)
See triage note. Presents with sinus pressure and headache  States sx's started yesterday  No fever or cough at present

## 2018-01-17 NOTE — ED Provider Notes (Signed)
[facility] Emergency Department Provider Note  ____________________________________________   First MD Initiated Contact with Patient 01/17/18 [id number]     (approximate)  I have reviewed the triage vital signs and the nursing notes.   HISTORY  Chief Complaint Sore Throat and Nasal Congestion   HPI Robert Suarez is a 52 y.o. male is here with complaint of sore throat, runny nose and cough that started yesterday.  Patient has not taken any over-the-counter medication for his symptoms.  He states nasal drainage is clear but sometimes yellow.  He denies any fever, chills, nausea or vomiting.  Patient also is here requesting refills of his blood pressure medication as he is not gotten a PCP since last year.  He states he always comes to the emergency department for his blood pressure medicine occasion.   Past Medical History:  Diagnosis Date  . Arthritis    right knee  . Family history of adverse reaction to anesthesia    son dx'd with malignant hyperthermia 3 yrs ago (after jaw surgery)  . Hypertension     Patient Active Problem List   Diagnosis Date Noted  . HTN (hypertension) 05/29/2016    Past Surgical History:  Procedure Laterality Date  . COLONOSCOPY WITH PROPOFOL N/A 07/30/2016   Procedure: COLONOSCOPY WITH PROPOFOL;  Surgeon: Jonathon Bellows, MD;  Location: [facility] ENDOSCOPY;  Service: Endoscopy;  Laterality: N/A;  . ligament repair Right   . NO PAST SURGERIES      Prior to Admission medications   Medication Sig Start Date End Date Taking? Authorizing Provider  amLODipine (NORVASC) 5 MG tablet Take 1 tablet (5 mg total) by mouth daily. 01/17/18 01/17/19  Johnn Hai, PA-C  brompheniramine-pseudoephedrine-DM 30-2-10 MG/5ML syrup Take 5 mLs by mouth 4 (four) times daily as needed. 01/17/18   Johnn Hai, PA-C  lisinopril (PRINIVIL,ZESTRIL) 10 MG tablet Take 1 tablet (10 mg total) by mouth 2 (two) times daily. 01/17/18   Johnn Hai, PA-C     Allergies Patient has no known allergies.  No family history on file.  Social History Social History   Tobacco Use  . Smoking status: Never Smoker  . Smokeless tobacco: Never Used  Substance Use Topics  . Alcohol use: No  . Drug use: No    Review of Systems Constitutional: No fever/chills Cardiovascular: Denies chest pain. Respiratory: Denies shortness of breath. Gastrointestinal:  No nausea, no vomiting.   Musculoskeletal: Negative for muscle aches. Skin: Negative for rash. Neurological: Negative for headaches, focal weakness or numbness. ___________________________________________   PHYSICAL EXAM:  VITAL SIGNS: ED Triage Vitals [01/17/18 0212]  Enc Vitals Group     BP (!) 134/96     Pulse Rate 85     Resp 16     Temp 98.3 F (36.8 C)     Temp Source Oral     SpO2 96 %     Weight 200 lb (90.7 kg)     Height 5\' 10"  (1.778 m)     Head Circumference      Peak Flow      Pain Score 7     Pain Loc      Pain Edu?      Excl. in [place]?    Constitutional: Alert and oriented. Well appearing and in no acute distress. Eyes: Conjunctivae are normal.  Head: Atraumatic. Nose: Mild congestion/rhinnorhea.  TMs are dull bilaterally. Mouth/Throat: Mucous membranes are moist.  Oropharynx non-erythematous. Neck: No stridor.   Hematological/Lymphatic/Immunilogical: No cervical lymphadenopathy.  Cardiovascular: Normal rate, regular rhythm. Grossly normal heart sounds.  Good peripheral circulation. Respiratory: Normal respiratory effort.  No retractions. Lungs CTAB. Musculoskeletal: Moves upper and lower extremities without any difficulty.  Normal gait was noted. Neurologic:  Normal speech and language. No gross focal neurologic deficits are appreciated.  Skin:  Skin is warm, dry and intact. No rash noted. Psychiatric: Mood and affect are normal. Speech and behavior are normal.  ____________________________________________   LABS (all labs ordered are listed, but only  abnormal results are displayed)  Labs Reviewed  GROUP A STREP BY PCR    PROCEDURES  Procedure(s) performed: None  Procedures  Critical Care performed: No  ____________________________________________   INITIAL IMPRESSION / ASSESSMENT AND PLAN / ED COURSE  As part of my medical decision making, I reviewed the following data within the electronic MEDICAL RECORD NUMBER Notes from prior ED visits and [place] Controlled Substance Database  Patient is here with complaint of upper wrist Tory symptoms that have been less than 24 hours.  He also is here for refill of his blood pressure medication as he has been noncompliant and finding a doctor for over a year.  Patient was given a prescription for Bromfed-DM as needed for cough and congestion.  He was also given a 30-day supply of his blood pressure medications with 1 refill.  He was given a list of clinics to begin calling to establish a PCP.  He states that he was seeing [facility] until he moved to [place] and this became inconvenient.  ____________________________________________   FINAL CLINICAL IMPRESSION(S) / ED DIAGNOSES  Final diagnoses:  Upper respiratory tract infection, unspecified type  Patient noncompliance  Hypertension, unspecified type     ED Discharge Orders        Ordered    cetirizine-pseudoephedrine (ZYRTEC-D ALLERGY & CONGESTION) 5-120 MG tablet  2 times daily,   Status:  Discontinued     01/17/18 0718    amLODipine (NORVASC) 5 MG tablet  Daily     01/17/18 0729    lisinopril (PRINIVIL,ZESTRIL) 10 MG tablet  2 times daily     01/17/18 0729    brompheniramine-pseudoephedrine-DM 30-2-10 MG/5ML syrup  4 times daily PRN     01/17/18 0729       Note:  This document was prepared using Dragon voice recognition software and may include unintentional dictation errors.    Johnn Hai, PA-C 01/17/18 0932    Harvest Dark, MD 01/17/18 [phone]

## 2018-01-17 NOTE — ED Triage Notes (Signed)
Pt states sore throat and runny nose since yesterday. Pt states nasal drainage is clear and yellow. Pt appears in no acute distress, unlabored resps.

## 2018-06-16 IMAGING — CR DG ELBOW COMPLETE 3+V*L*
1 series · 4 of 4 positions shown · non-contrast
Comparison: None in PACs

CLINICAL DATA: Pain in the proximal humerus laterally extending
into the elbow. The pain is made worse with movement. No acute or
old injury.

EXAM:
LEFT ELBOW - COMPLETE 3+ VIEW

[Series 1: dg elbow complete left (3+view) · 0.14mm/px · 4 of 4 slices shown]
[im 1/4]
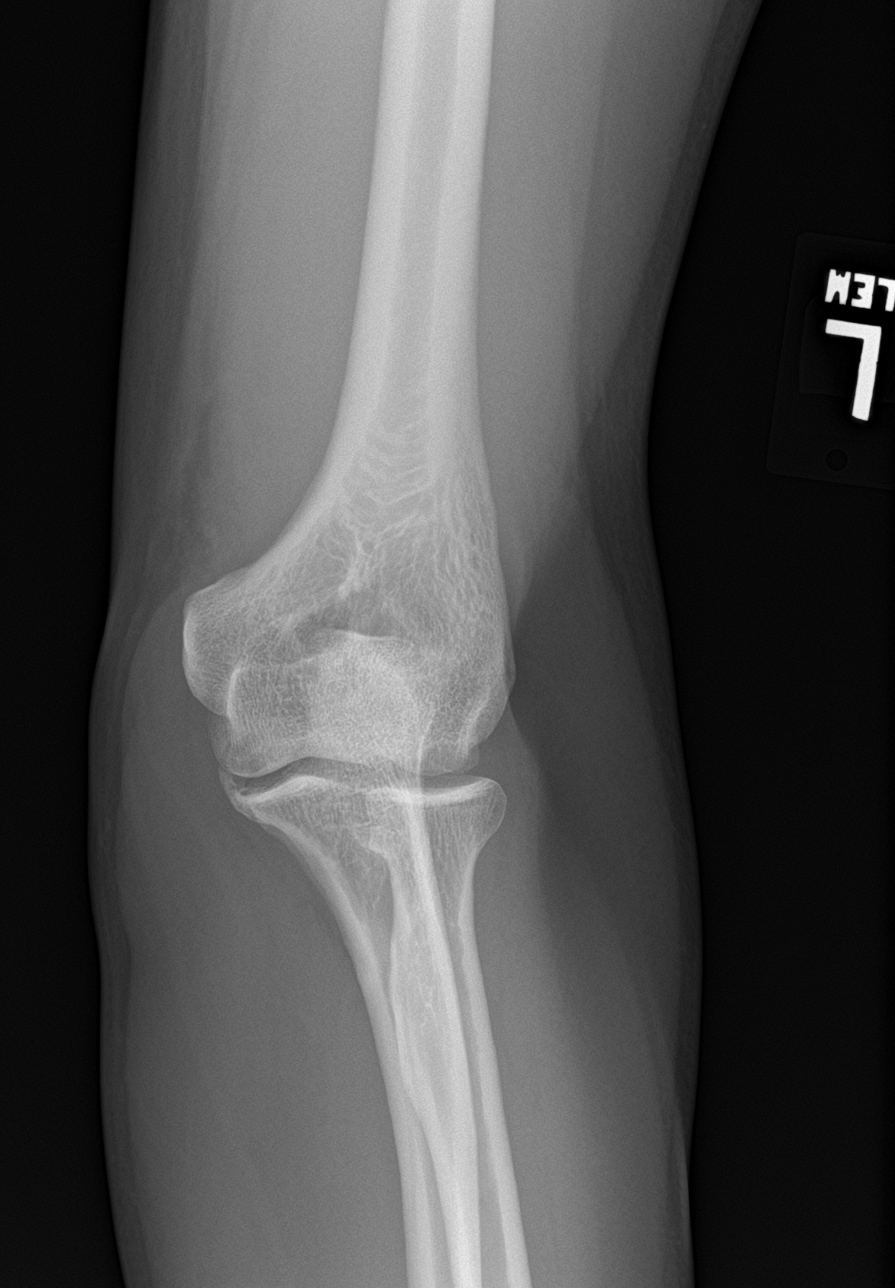
[im 2/4]
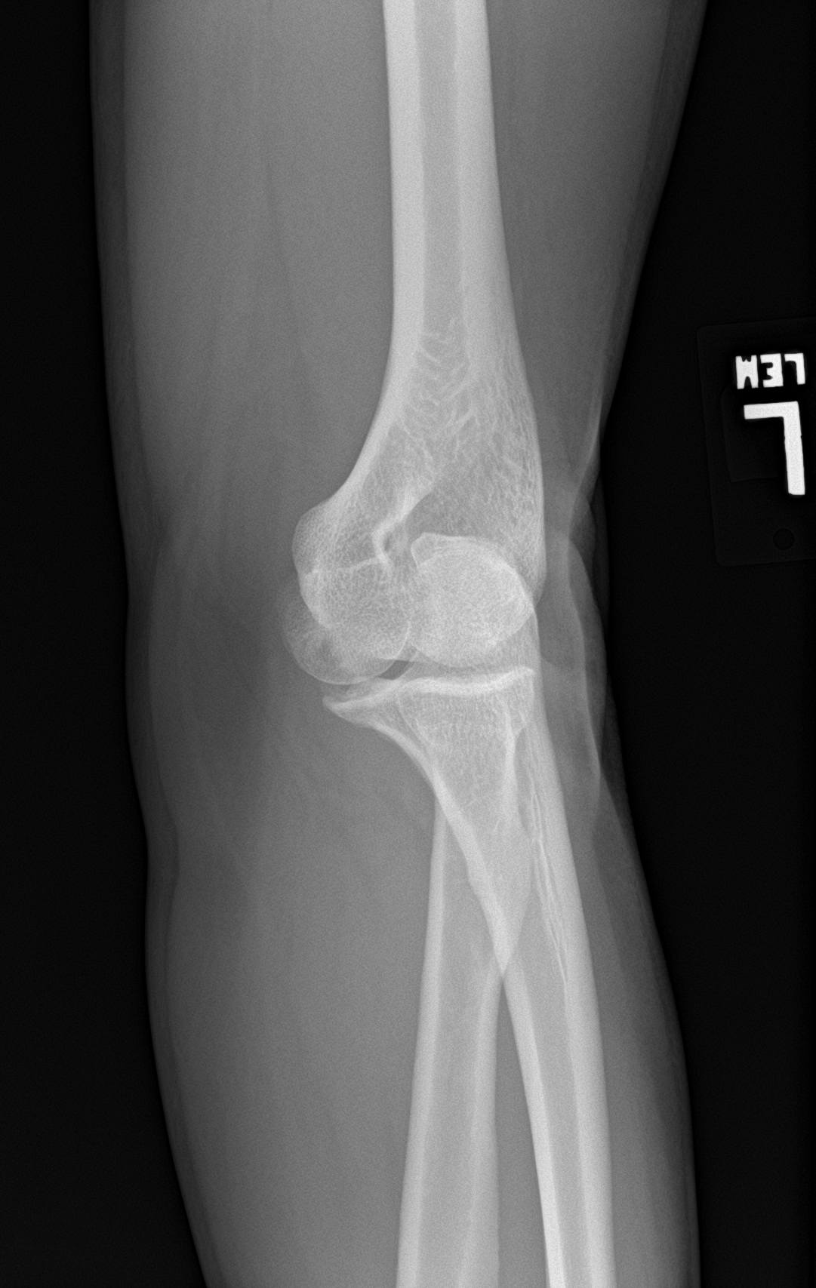
[im 3/4]
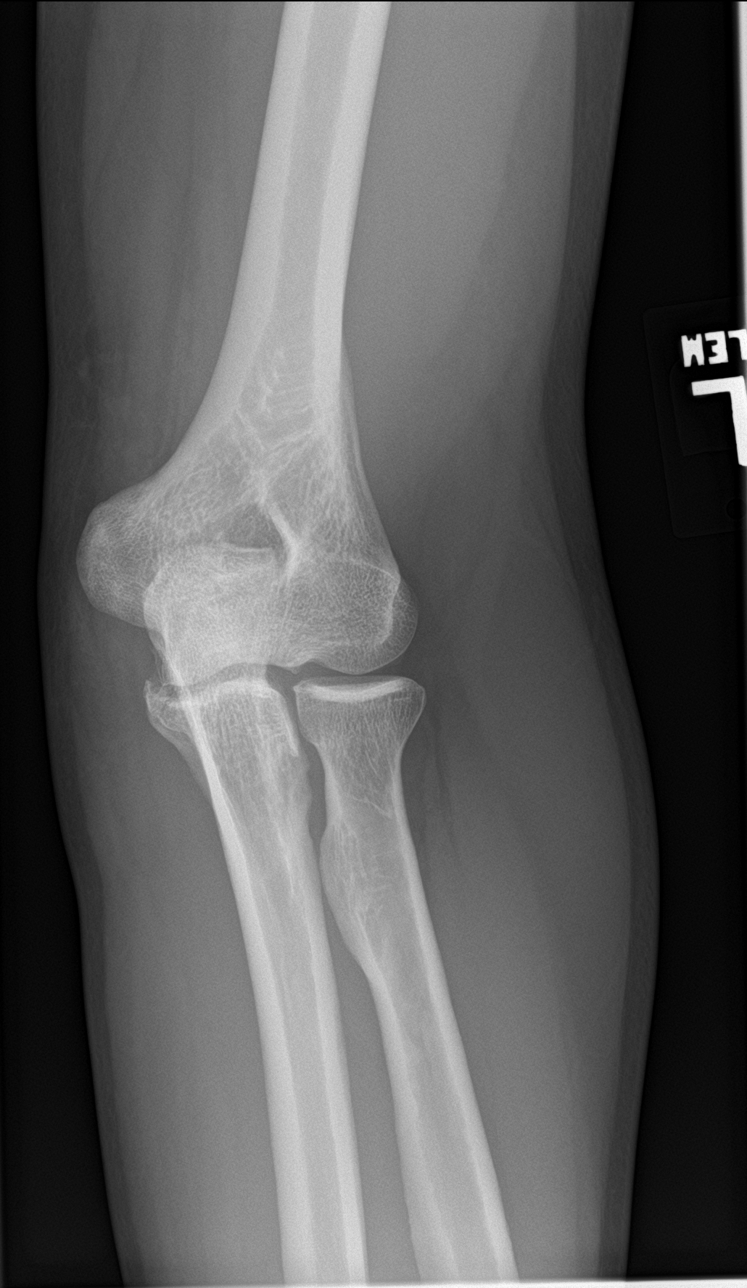
[im 4/4]
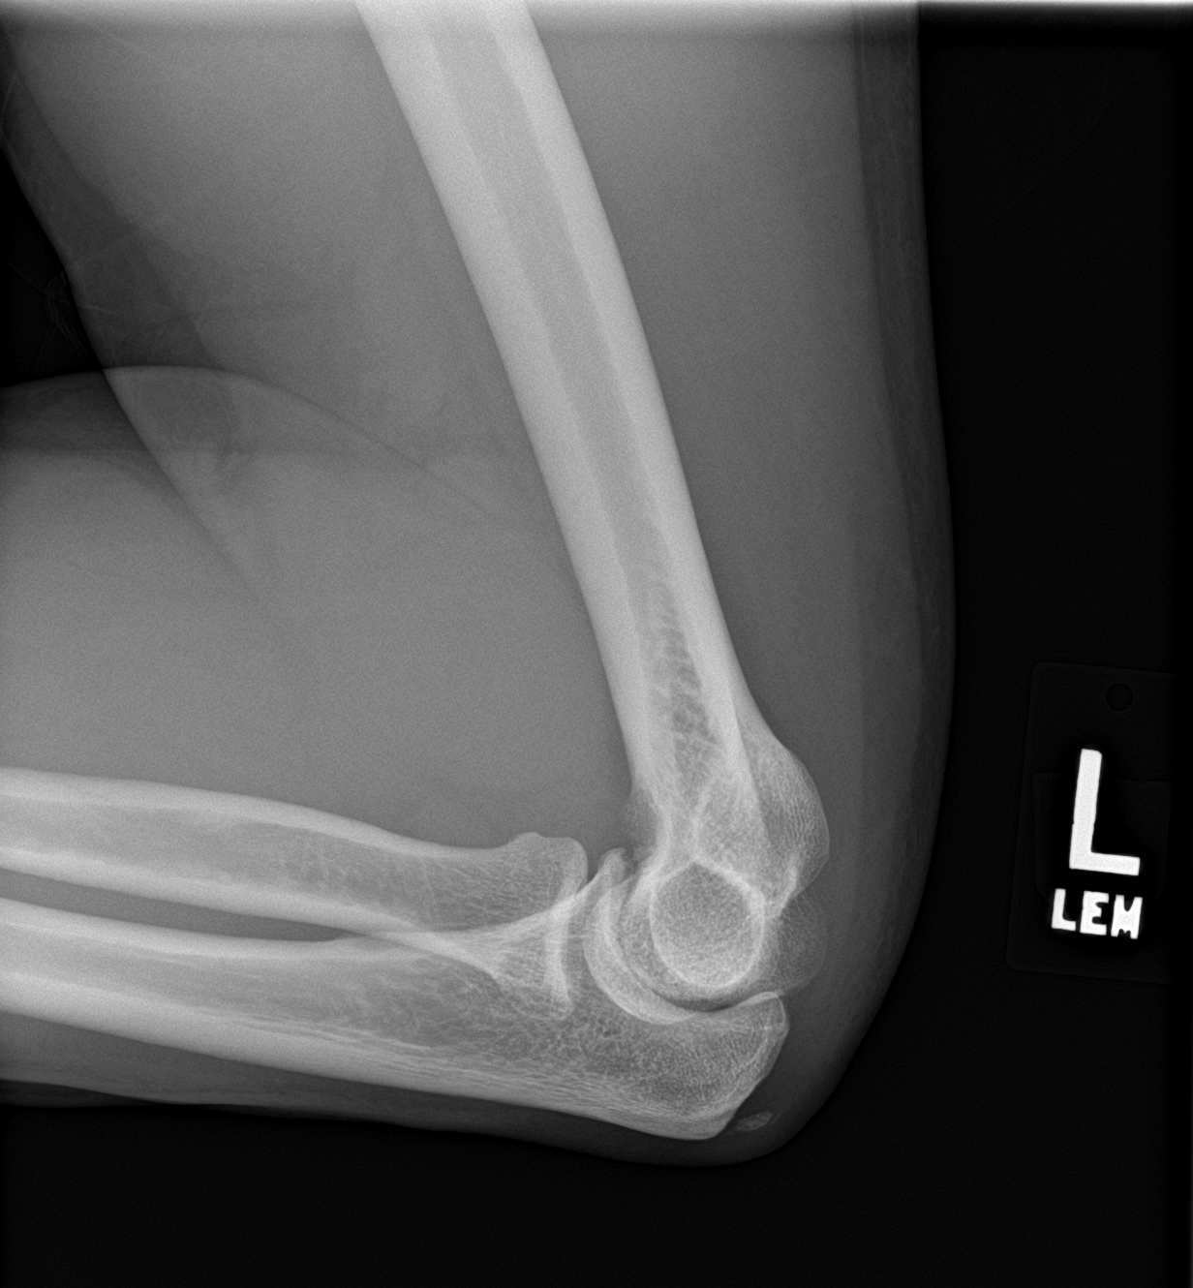

[4 of 4 positions shown; findings below may reference images not displayed]

FINDINGS: The bones are subjectively adequately mineralized. The radial head
is intact as is the olecranon. Small spurs arise from the coronoid
process of the olecranon. The condylar and supracondylar regions of
the humerus are normal. There is a small olecranon spur. There is no
significant joint effusion.
IMPRESSION: Minimal osteoarthritic changes of the left elbow. There is a small
olecranon spur. No acute bony abnormality.

## 2018-06-16 IMAGING — CR DG SHOULDER 2+V*L*
1 series · 3 of 3 positions shown · non-contrast
Comparison: None.

CLINICAL DATA: Pain and decreased range of motion

EXAM:
LEFT SHOULDER - 2+ VIEW

[Series 1: dg shoulder left · 0.14mm/px · 3 of 3 slices shown]
[im 1/3]
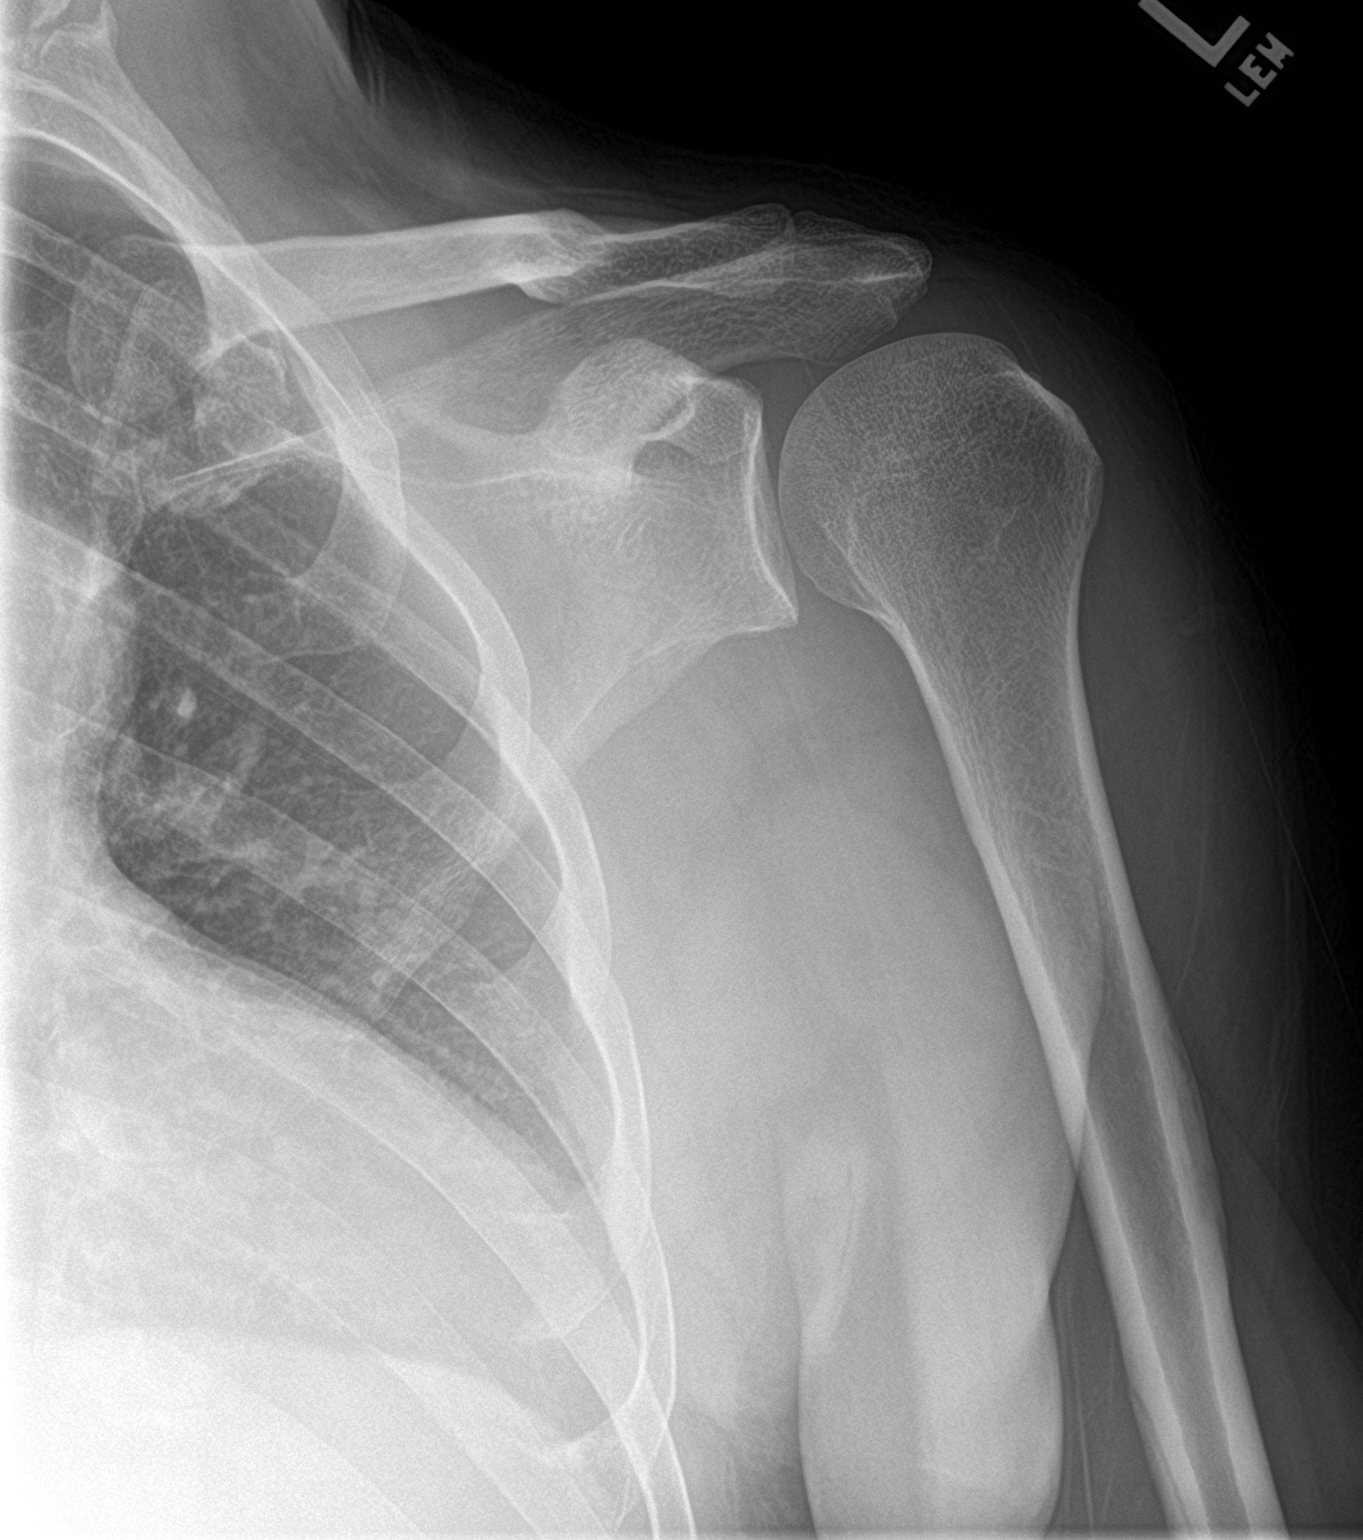
[im 2/3]
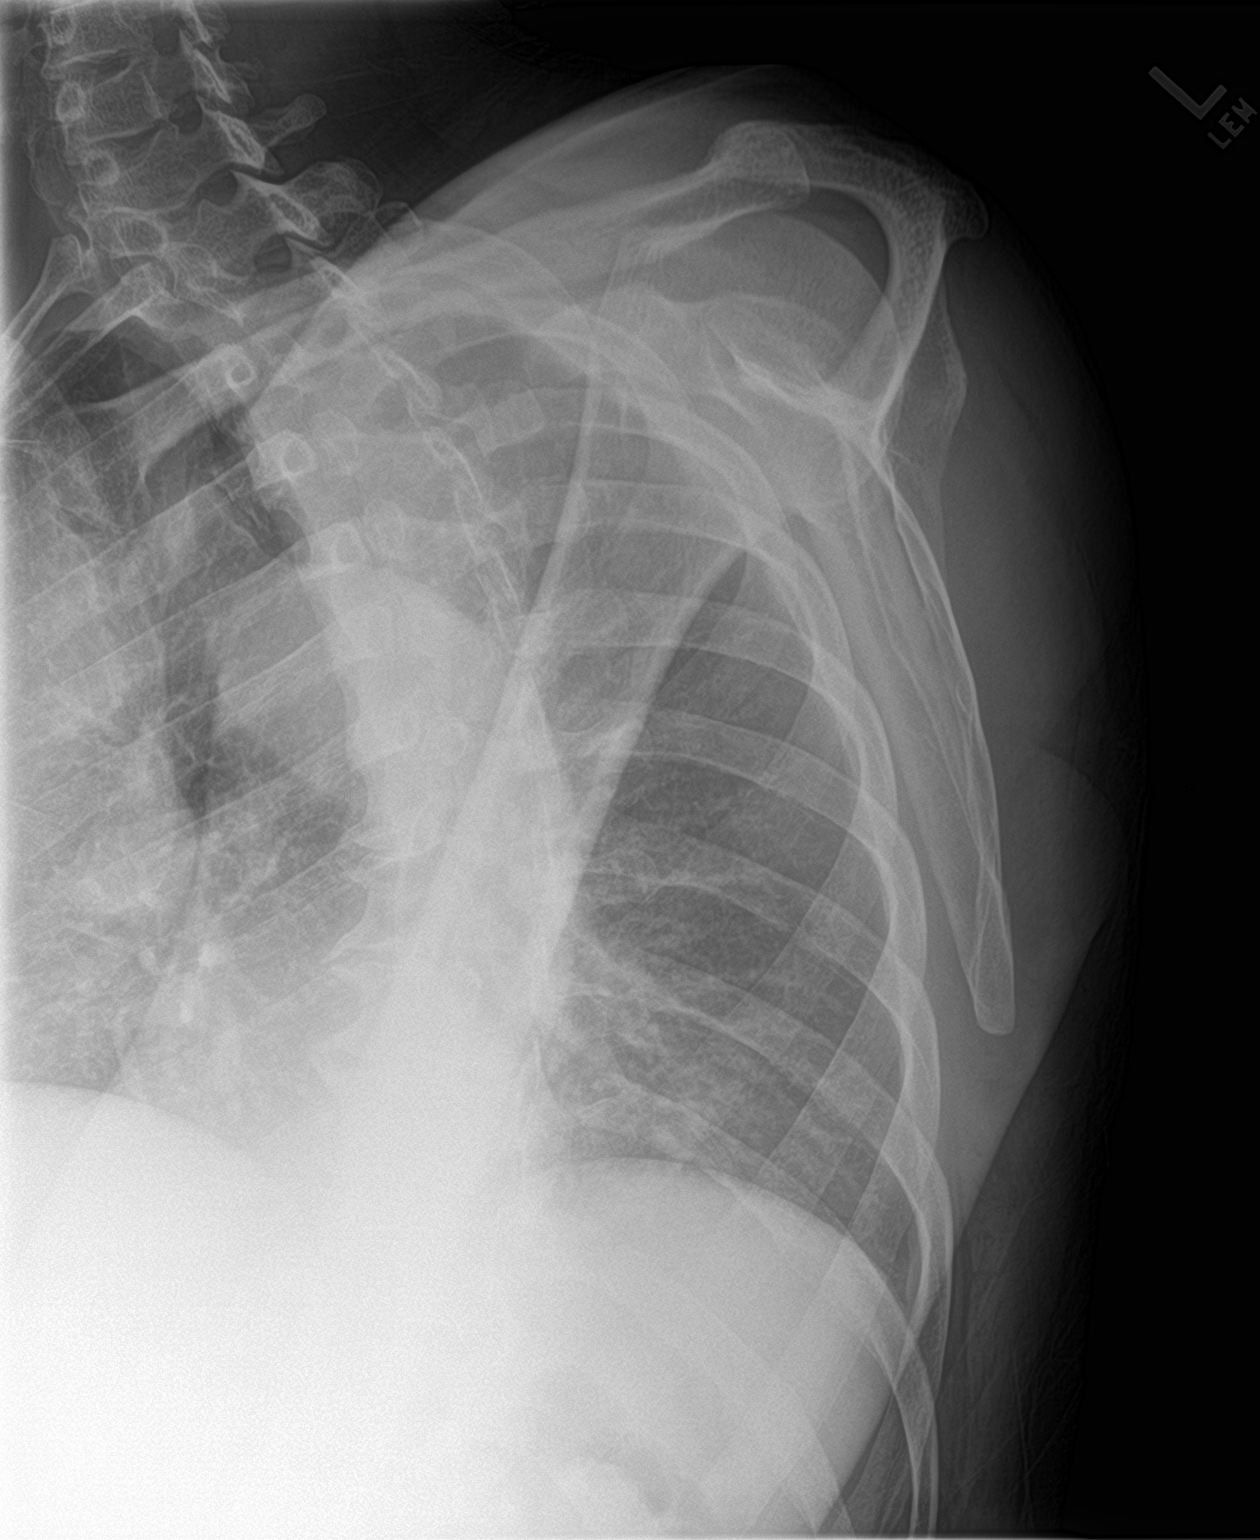
[im 3/3]
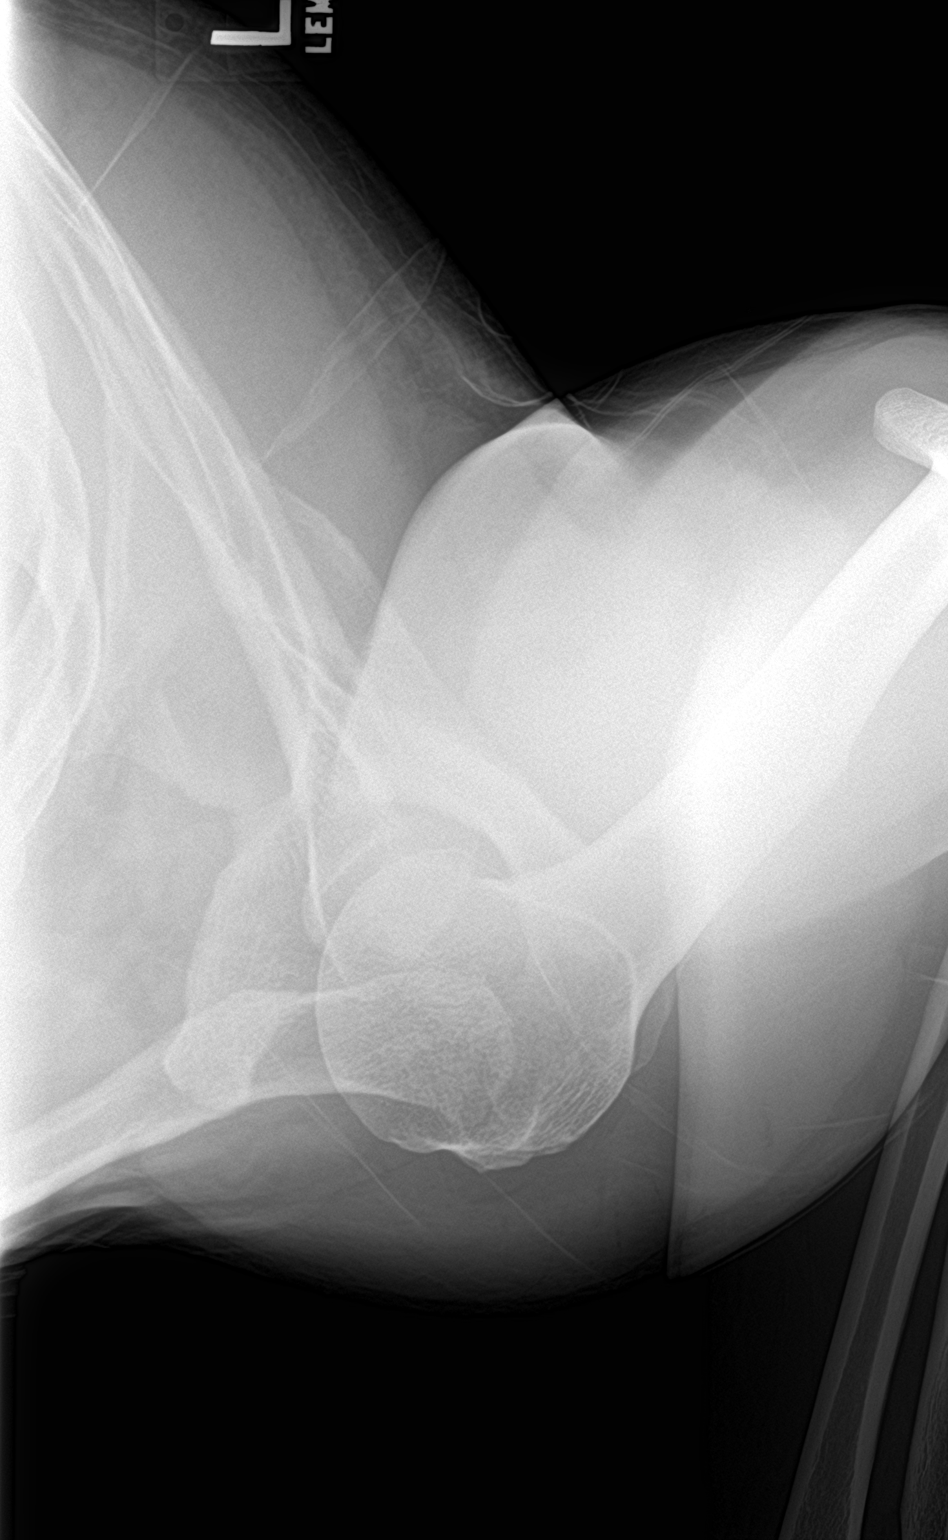

[3 of 3 positions shown; findings below may reference images not displayed]

FINDINGS: Oblique, lateral, and Y scapular images obtained. No fracture or
dislocation. Joint spaces appear intact. No erosive change.
Visualized left lung clear.
IMPRESSION: No fracture or dislocation.  No evident arthropathy.

## 2019-01-17 ENCOUNTER — Ambulatory Visit: Payer: BLUE CROSS/BLUE SHIELD | Admitting: Internal Medicine

## 2019-07-18 ENCOUNTER — Other Ambulatory Visit: Payer: Self-pay

## 2019-07-18 ENCOUNTER — Ambulatory Visit
Admission: EM | Admit: 2019-07-18 | Discharge: 2019-07-18 | Disposition: A | Discharge status: Home / Self Care | Payer: BC Managed Care – PPO | Attending: Family Medicine | Admitting: Family Medicine

## 2019-07-18 DIAGNOSIS — I10 Essential (primary) hypertension: Secondary | ICD-10-CM

## 2019-07-18 DIAGNOSIS — R05 Cough: Secondary | ICD-10-CM | POA: Diagnosis not present

## 2019-07-18 DIAGNOSIS — Z7189 Other specified counseling: Secondary | ICD-10-CM

## 2019-07-18 DIAGNOSIS — Z20828 Contact with and (suspected) exposure to other viral communicable diseases: Secondary | ICD-10-CM

## 2019-07-18 DIAGNOSIS — Z760 Encounter for issue of repeat prescription: Secondary | ICD-10-CM | POA: Diagnosis not present

## 2019-07-18 DIAGNOSIS — J029 Acute pharyngitis, unspecified: Secondary | ICD-10-CM | POA: Diagnosis not present

## 2019-07-18 DIAGNOSIS — J019 Acute sinusitis, unspecified: Secondary | ICD-10-CM | POA: Diagnosis not present

## 2019-07-18 DIAGNOSIS — R519 Headache, unspecified: Secondary | ICD-10-CM | POA: Diagnosis not present

## 2019-07-18 DIAGNOSIS — Z20822 Contact with and (suspected) exposure to covid-19: Secondary | ICD-10-CM

## 2019-07-18 LAB — RAPID STREP SCREEN (MED CTR MEBANE ONLY): Streptococcus, Group A Screen (Direct): NEGATIVE

## 2019-07-18 MED ORDER — LISINOPRIL 10 MG PO TABS: 10.0000 mg | ORAL_TABLET | Freq: Two times a day (BID) | ORAL | 1 refills | Status: DC

## 2019-07-18 MED ORDER — AMLODIPINE BESYLATE 5 MG PO TABS: 5.0000 mg | ORAL_TABLET | Freq: Every day | ORAL | 1 refills | Status: DC

## 2019-07-18 MED ORDER — PSEUDOEPH-BROMPHEN-DM 30-2-10 MG/5ML PO SYRP: 5.0000 mL | ORAL_SOLUTION | Freq: Four times a day (QID) | ORAL | 0 refills | Status: DC | PRN

## 2019-07-18 MED ORDER — AMOXICILLIN-POT CLAVULANATE 875-125 MG PO TABS: 1.0000 | ORAL_TABLET | Freq: Two times a day (BID) | ORAL | 0 refills | Status: AC

## 2019-07-18 NOTE — ED Triage Notes (Signed)
Pt. States "Thursday he was eating popcorn and notice his tooth or filling came out" states nothing else about his symptoms.

## 2019-07-18 NOTE — Discharge Instructions (Signed)
It was very nice seeing you today in clinic. Thank you for entrusting me with your care.   Rest and increase fluid intake. I have sent in antibiotics and refilled your blood pressure medications. You need to establish care with a primary care physician ongoing management.   Make arrangements to follow up with your regular doctor in 1 week for re-evaluation if not improving. If your symptoms/condition worsens, please seek follow up care either here or in the ER. Please remember, our [facility] providers are "right here with you" when you need [place].   Again, it was my pleasure to take care of you today. Thank you for choosing our clinic. I hope that you start to feel better quickly.   Honor Loh, MSN, APRN, FNP-C, CEN Advanced Practice Provider [facility]

## 2019-07-18 NOTE — ED Provider Notes (Signed)
[place], [place]   Name: Robert Suarez DOB: 02/23/1966 MRN: [id number] CSN: [id number] PCP: Patient, No Pcp Per  Arrival date and time:  07/18/19 1050  Chief Complaint:  Sore Throat   NOTE: Prior to seeing the patient today, I have reviewed the triage nursing documentation and vital signs. Clinical staff has updated patient's PMH/PSHx, current medication list, and drug allergies/intolerances to ensure comprehensive history available to assist in medical decision making.   History:   HPI: Robert Suarez is a 53 y.o. male who presents today with complaints of cough, sore throat, paranasal sinus tenderness, generalized headache, and BILATERAL ear pain that began on Thursday (07/13/2019). Symptoms started out with a sore throat only that began after a portion of his dental filling came out while eating popcorn. He took APAP that night and went to bed. Patient woke up on Friday morning with an acute progression of his symptoms. He has been having yellow rhinorrhea. He denies associated fevers. Cough has been non-productive. He denies that he has experienced any nausea, vomiting, diarrhea, or abdominal pain. He is eating and drinking well. Patient denies any perceived alterations to his sense of taste or smell. PMH is significant for seasonal allergies. He has been taking fexofenadine and fluticasone without improvement in his symptoms. Daughter and wife are being seen today with similar symptoms. He otherwise denies close contact with anyone known to be ill. He is requesting to be tested for SARS-CoV-2 (novel coronavirus) while here today.   Additionally, patient reporting that he is out of his regular antihypertensive medications. He take lisinopril 10 mg BID and amlodipine 5 mg daily. Patient reporting that he has been unable to get in with his PCP due to scheduling restrictions imposed during the current COVID-19 pandemic. He is unable to advise how long it has been since he was on his medications. He  presents to the clinic today HYPERtensive at 173/105. He denies any associated chest pain, shortness of breath, palpitations, headaches, and visual changes. Patient is asking for his medications to be refilled today.   Past Medical History:  Diagnosis Date   Arthritis    right knee   Family history of adverse reaction to anesthesia    son dx'd with malignant hyperthermia 3 yrs ago (after jaw surgery)   Hypertension     Past Surgical History:  Procedure Laterality Date   COLONOSCOPY WITH PROPOFOL N/A 07/30/2016   Procedure: COLONOSCOPY WITH PROPOFOL;  Surgeon: Jonathon Bellows, MD;  Location: [facility] ENDOSCOPY;  Service: Endoscopy;  Laterality: N/A;   ligament repair Right    NO PAST SURGERIES      Family History  Problem Relation Age of Onset   Healthy Mother    Healthy Father     Social History   Tobacco Use   Smoking status: Never Smoker   Smokeless tobacco: Never Used  Substance Use Topics   Alcohol use: No   Drug use: No    Patient Active Problem List   Diagnosis Date Noted   HTN (hypertension) 05/29/2016    Home Medications:    No outpatient medications have been marked as taking for the 07/18/19 encounter [facility] Encounter).    Allergies:   Patient has no known allergies.  Review of Systems (ROS): Review of Systems  Constitutional: Negative for fatigue and fever.  HENT: Positive for dental problem, ear pain, rhinorrhea, sinus pressure, sinus pain and sore throat. Negative for congestion, ear discharge, postnasal drip, sneezing and trouble swallowing.   Eyes: Negative for pain, discharge and  redness.  Respiratory: Positive for cough. Negative for chest tightness and shortness of breath.   Cardiovascular: Negative for chest pain and palpitations.       PMH (+) HTN  Gastrointestinal: Negative for abdominal pain, diarrhea, nausea and vomiting.  Musculoskeletal: Negative for arthralgias, back pain, myalgias and neck pain.  Skin: Negative for color  change, pallor and rash.  Allergic/Immunologic: Positive for environmental allergies (seasonal).  Neurological: Positive for headaches. Negative for dizziness, syncope and weakness.  Hematological: Negative for adenopathy.     Vital Signs: Today's Vitals   07/18/19 1115 07/18/19 1121  BP:  (!) 173/105  Pulse:  74  Resp:  19  Temp:  98.8 F (37.1 C)  TempSrc:  Oral  SpO2:  99%  Weight: 200 lb (90.7 kg)   PainSc: 7      Physical Exam: Physical Exam  Constitutional: He is oriented to person, place, and time and well-developed, well-nourished, and in no distress. No distress.  Acutely ill appearing; fatigued/listless.  HENT:  Head: Normocephalic and atraumatic.  Right Ear: There is tenderness. No drainage. Tympanic membrane is injected. Tympanic membrane is not bulging.  Left Ear: There is tenderness. No drainage. Tympanic membrane is injected. Tympanic membrane is not bulging.  Nose: Mucosal edema, rhinorrhea and sinus tenderness present.  Mouth/Throat: Uvula is midline and mucous membranes are normal. Posterior oropharyngeal erythema present. No posterior oropharyngeal edema.  Eyes: Pupils are equal, round, and reactive to light. Conjunctivae and EOM are normal.  Neck: Normal range of motion. Neck supple.  Cardiovascular: Normal rate, regular rhythm, normal heart sounds and intact distal pulses. Exam reveals no gallop and no friction rub.  No murmur heard. Pulmonary/Chest: Effort normal. No respiratory distress. He has no decreased breath sounds. He has no wheezes. He has rhonchi (upper; clears with cough). He has no rales.  Abdominal: Soft. Normal appearance and bowel sounds are normal. He exhibits no distension. There is no abdominal tenderness.  Musculoskeletal: Normal range of motion.  Lymphadenopathy:       Head (right side): Submandibular adenopathy present.       Head (left side): Submandibular adenopathy present.  Neurological: He is alert and oriented to person, place,  and time. Gait normal.  Skin: Skin is warm and dry. No rash noted. He is not diaphoretic.  Psychiatric: Mood, memory, affect and judgment normal.  Nursing note and vitals reviewed.   Urgent Care Treatments / Results:   LABS: PLEASE NOTE: all labs that were ordered this encounter are listed, however only abnormal results are displayed. Labs Reviewed  RAPID STREP SCREEN ([facility] ONLY)  NOVEL CORONAVIRUS, NAA (HOSP ORDER, SEND-OUT TO REF LAB; TAT 18-24 HRS)  CULTURE, GROUP A STREP [facility])    EKG: -None  RADIOLOGY: No results found.  PROCEDURES: Procedures  MEDICATIONS RECEIVED THIS VISIT: Medications - No data to display  PERTINENT CLINICAL COURSE NOTES/UPDATES:   Initial Impression / Assessment and Plan / Urgent Care Course:  Pertinent labs & imaging results that were available during my care of the patient were personally reviewed by me and considered in my medical decision making (see lab/imaging section of note for values and interpretations).  Robert Suarez is a 53 y.o. male who presents to [facility] today with complaints of Sore Throat   Patient overall well appearing and in no acute distress today in clinic. Presenting symptoms (see HPI) and exam as documented above. He presents with symptoms associated with SARS-CoV-2 (novel coronavirus). Discussed typical symptom constellation. Reviewed potential for  infection and need for testing. Patient amenable to being tested. SARS-CoV-2 swab collected by certified clinical staff. Discussed variable turn around times associated with testing, as swabs are being processed at [facility], and have been taking between 2-5 days to come back. He was advised to self quarantine, per [facility] DHHS guidelines, until negative results received.   Rapid streptococcal throat swab (-); reflex culture sent. Symptoms progressive since last week despite allergy medication and nasal spray. Wife and daughter ill with similar symptoms. Until ruled out  with confirmatory lab testing, SARS-CoV-2 remains part of the differential.  His testing is pending at this time. Based on his exam today, will proceed with treatment for acute sinusitis using a 10 day course of amoxicillin-clavulanate. Discussed supportive care measures at home during acute phase of illness. Patient to rest as much as possible. He was encouraged to ensure adequate hydration (water and ORS) to prevent dehydration and electrolyte derangements. Patient may use APAP and/or IBU on an as needed basis for pain/fever. He was advised to avoid all decongestants due to his HTN Dx.   Regarding his blood pressure. Patient HYPERtensive at 173/105 today in clinic. He denies any current or recent chest pain, SOB, palpitations, headaches, weakness, and visual changes. He is out of his normal medications and is unable to advise how long he has been out. He has been unable to see PCP due to scheduling restrictions related to [place]. Will refill amlodipine and lisinopril today at previous dosages. Patient advised to call PCP to discuss potential virtual visit for routine re-evaluation and ongoing health maintenance needs. He verbalized understanding.   Current clinical condition warrants patient being out of work in order to quarantine while waiting for testing results. He was provided with the appropriate documentation to provide to his place of employment that will allow for him to RTW on 07/21/2019 with no restrictions. RTW is contingent on his SARS-CoV-2 test results being reviewed as negative.   Discussed follow up with primary care physician in 1 week for re-evaluation. I have reviewed the follow up and strict return precautions for any new or worsening symptoms. Patient is aware of symptoms that would be deemed urgent/emergent, and would thus require further evaluation either here or in the emergency department. At the time of discharge, he verbalized understanding and consent with the discharge plan as it  was reviewed with him. All questions were fielded by provider and/or clinic staff prior to patient discharge.    Final Clinical Impressions / Urgent Care Diagnoses:   Final diagnoses:  Acute non-recurrent sinusitis, unspecified location  Encounter for laboratory testing for COVID-19 virus  Advice given about COVID-19 virus infection  Medication refill  Essential hypertension    New Prescriptions:  [place] Controlled Substance Registry consulted? Not Applicable  Meds ordered this encounter  Medications   amLODipine (NORVASC) 5 MG tablet    Sig: Take 1 tablet (5 mg total) by mouth daily.    Dispense:  30 tablet    Refill:  0   amoxicillin-clavulanate (AUGMENTIN) 875-125 MG tablet    Sig: Take 1 tablet by mouth 2 (two) times daily for 10 days.    Dispense:  20 tablet    Refill:  0   lisinopril (ZESTRIL) 10 MG tablet    Sig: Take 1 tablet (10 mg total) by mouth 2 (two) times daily.    Dispense:  60 tablet    Refill:  0   Recommended Follow up Care:  Patient encouraged to follow up with the following  provider within the specified time frame, or sooner as dictated by the severity of his symptoms. As always, he was instructed that for any urgent/emergent care needs, he should seek care either here or in the emergency department for more immediate evaluation.  Follow-up Information    PCP In 1 week.   Why: General reassessment of symptoms and follow up on blood pressure.        NOTE: This note was prepared using Lobbyist along with smaller Company secretary. Despite my best ability to proofread, there is the potential that transcriptional errors may still occur from this process, and are completely unintentional.     Karen Kitchens, NP 07/19/19 2259

## 2019-07-19 LAB — NOVEL CORONAVIRUS, NAA (HOSP ORDER, SEND-OUT TO REF LAB; TAT 18-24 HRS): SARS-CoV-2, NAA: NOT DETECTED

## 2019-07-21 LAB — CULTURE, GROUP A STREP (THRC)

## 2019-09-28 ENCOUNTER — Ambulatory Visit
Admission: EM | Admit: 2019-09-28 | Discharge: 2019-09-28 | Disposition: A | Discharge status: Home / Self Care | Payer: BC Managed Care – PPO | Attending: Urgent Care | Admitting: Urgent Care

## 2019-09-28 ENCOUNTER — Encounter: Payer: Self-pay | Admitting: Emergency Medicine

## 2019-09-28 ENCOUNTER — Other Ambulatory Visit: Payer: Self-pay

## 2019-09-28 DIAGNOSIS — F458 Other somatoform disorders: Secondary | ICD-10-CM

## 2019-09-28 DIAGNOSIS — I10 Essential (primary) hypertension: Secondary | ICD-10-CM

## 2019-09-28 DIAGNOSIS — R0989 Other specified symptoms and signs involving the circulatory and respiratory systems: Secondary | ICD-10-CM

## 2019-09-28 DIAGNOSIS — K219 Gastro-esophageal reflux disease without esophagitis: Secondary | ICD-10-CM

## 2019-09-28 MED ORDER — OMEPRAZOLE 20 MG PO CPDR: 20.0000 mg | DELAYED_RELEASE_CAPSULE | Freq: Every day | ORAL | 1 refills | Status: DC

## 2019-09-28 NOTE — ED Provider Notes (Signed)
[name], [place]   Name: Robert Suarez DOB: 01-14-1966 MRN: [id number] CSN: [id number] PCP: Patient, No Pcp Per  Arrival date and time:  09/28/19 0857  Chief Complaint:  Sore Throat   NOTE: Prior to seeing the patient today, I have reviewed the triage nursing documentation and vital signs. Clinical staff has updated patient's PMH/PSHx, current medication list, and drug allergies/intolerances to ensure comprehensive history available to assist in medical decision making.   History:   HPI: Robert Suarez is a 54 y.o. male who presents today with complaints of pain in his throat associated with swallowing. Patient describes a globus sensation with swallowing certain foods. He states, "it is like it gets hung and makes me choke". Patient also reports coughing and choking associated with consumption of cold liquids. He describes the sensation of hot and cold liquids "bubbling up" in his throat from time to time. He is unable to advise how long this has been going other beyond stating it has been "awhile". PMH (+) for GERD. Patient reports that he was formally on medications, but eventually self discontinued them citing that he could not appreciate any differences while on the medication. Patient has been seen by gastroenterology in the past per his report for routine colonoscopy only. He has never had an endoscopic evaluation of his esophagus to his knowledge. He denies that he has experienced any nausea, vomiting, diarrhea, or abdominal pain. He is eating and drinking well and weight has remained stable. Patient presents to the clinic today with an elevated blood pressure of 160/123. He denies associated chest pain, shortness of breath, palpitation, dizziness, and weakness. He reports that he forgot to take his antihypertensive medications this morning; takes lisinopril and amlodipine.   Past Medical History:  Diagnosis Date  . Arthritis    right knee  . Family history of adverse reaction to anesthesia      son dx'd with malignant hyperthermia 3 yrs ago (after jaw surgery)  . Hypertension     Past Surgical History:  Procedure Laterality Date  . COLONOSCOPY WITH PROPOFOL N/A 07/30/2016   Procedure: COLONOSCOPY WITH PROPOFOL;  Surgeon: Jonathon Bellows, MD;  Location: [facility] ENDOSCOPY;  Service: Endoscopy;  Laterality: N/A;  . ligament repair Right   . NO PAST SURGERIES      Family History  Problem Relation Age of Onset  . Healthy Mother   . Healthy Father     Social History   Tobacco Use  . Smoking status: Never Smoker  . Smokeless tobacco: Never Used  Substance Use Topics  . Alcohol use: No  . Drug use: No    Patient Active Problem List   Diagnosis Date Noted  . HTN (hypertension) 05/29/2016    Home Medications:    Current Meds  Medication Sig  . amLODipine (NORVASC) 5 MG tablet Take 1 tablet (5 mg total) by mouth daily.  Marland Kitchen lisinopril (ZESTRIL) 10 MG tablet Take 1 tablet (10 mg total) by mouth 2 (two) times daily.    Allergies:   Patient has no known allergies.  Review of Systems (ROS): Review of Systems  Constitutional: Negative for chills and fever.  HENT: Positive for trouble swallowing.        (+) globus sensation  Respiratory: Negative for cough, shortness of breath, wheezing and stridor.   Cardiovascular: Negative for chest pain and palpitations.  Gastrointestinal: Negative for abdominal pain, nausea and vomiting.       PMH (+) GERD; not on medication  Skin: Negative for color change, pallor  and rash.  Neurological: Negative for dizziness, syncope, weakness and numbness.  All other systems reviewed and are negative.    Vital Signs: Today's Vitals   09/28/19 0935 09/28/19 0938 09/28/19 0940 09/28/19 0958  BP:   (!) 160/123   Pulse:   84   Resp:   18   Temp:   98.7 F (37.1 C)   TempSrc:   Oral   SpO2:   98%   Weight:  200 lb (90.7 kg)    Height:  5\' 10"  (1.778 m)    PainSc: 1    1     Physical Exam: Physical Exam  Constitutional: He is oriented  to person, place, and time and well-developed, well-nourished, and in no distress.  HENT:  Head: Normocephalic and atraumatic.  Mouth/Throat: Uvula is midline, oropharynx is clear and moist and mucous membranes are normal.  Eyes: Pupils are equal, round, and reactive to light.  Neck: No tracheal deviation present.  Cardiovascular: Normal rate, regular rhythm, normal heart sounds and intact distal pulses.  Pulmonary/Chest: Effort normal and breath sounds normal.  Abdominal: Soft. Bowel sounds are normal. He exhibits no distension. There is no abdominal tenderness.  Musculoskeletal:     Cervical back: Normal range of motion and neck supple.  Lymphadenopathy:    He has no cervical adenopathy.  Neurological: He is alert and oriented to person, place, and time. Gait normal.  Skin: Skin is warm and dry. No rash noted. He is not diaphoretic.  Psychiatric: Mood, memory, affect and judgment normal.  Nursing note and vitals reviewed.   Urgent Care Treatments / Results:   Orders Placed This Encounter  Procedures  . Referral to gastrotenerology - [facility]   LABS: PLEASE NOTE: all labs that were ordered this encounter are listed, however only abnormal results are displayed. Labs Reviewed - No data to display  EKG: -None  RADIOLOGY: No results found.  PROCEDURES: Procedures  MEDICATIONS RECEIVED THIS VISIT: Medications - No data to display  PERTINENT CLINICAL COURSE NOTES/UPDATES:   Initial Impression / Assessment and Plan / Urgent Care Course:  Pertinent labs & imaging results that were available during my care of the patient were personally reviewed by me and considered in my medical decision making (see lab/imaging section of note for values and interpretations).  [name] is a 54 y.o. male who presents to [facility] today with complaints of Sore Throat  Patient is well appearing overall in clinic today. He does not appear to be in any acute distress. Presenting symptoms  (see HPI) and exam as documented above. Exam unremarkable. Suspect GERD with possible degree of esophageal stricture. Patient has seen GI in the past for colonoscopy only. Discussed need for swallow study and endoscopic evaluation on the esophagus with +/- intra-procedural dilatation. Will refer to GI Allen Norris, MD) for further evaluation. Patient advised to call the office if he does not hear from them today or tomorrow. In the interim, while waiting to be seen by GI, will start on daily PPI therapy (omeprazole) in efforts to reduce current symptoms. Patient encouraged to take antihypertensives as prescribed and to follow up with his PCP as scheduled for ongoing monitoring.   Current clinical condition warrants patient being out of work in order to recover from his current injury/illness. He was provided with the appropriate documentation to provide to his place of employment that will allow for him to RTW on 09/29/2019 with no restrictions.   Discussed follow up with primary care physician in 1  week for re-evaluation. I have reviewed the follow up and strict return precautions for any new or worsening symptoms. Patient is aware of symptoms that would be deemed urgent/emergent, and would thus require further evaluation either here or in the emergency department. At the time of discharge, he verbalized understanding and consent with the discharge plan as it was reviewed with him. All questions were fielded by provider and/or clinic staff prior to patient discharge.    Final Clinical Impressions / Urgent Care Diagnoses:   Final diagnoses:  Gastroesophageal reflux disease, unspecified whether esophagitis present  Globus sensation  Hypertension, unspecified type    New Prescriptions:  [place] Controlled Substance Registry consulted? Not Applicable  Meds ordered this encounter  Medications  . omeprazole (PRILOSEC) 20 MG capsule    Sig: Take 1 capsule (20 mg total) by mouth daily.    Dispense:  30 capsule     Refill:  1    Recommended Follow up Care:  Patient encouraged to follow up with the following provider within the specified time frame, or sooner as dictated by the severity of his symptoms. As always, he was instructed that for any urgent/emergent care needs, he should seek care either here or in the emergency department for more immediate evaluation.  Follow-up Information    Schedule an appointment as soon as possible for a visit  with Lucilla Lame, MD.   Specialty: Gastroenterology Why: Office should call you for an appointment. Need to discuss having endoscopy or swallow study. Contact information: [address] [place]  [place] [address] [phone]         NOTE: This note was prepared using Dragon dictation software along with smaller phrase technology. Despite my best ability to proofread, there is the potential that transcriptional errors may still occur from this process, and are completely unintentional.    Karen Kitchens, NP 09/28/19 2327

## 2019-09-28 NOTE — Discharge Instructions (Signed)
It was very nice seeing you today in clinic. Thank you for entrusting me with your care.   Start reflux medication and take daily. I recommend in the mornings about 30 minutes before eating. I have sent in a referral to GI for you to go in to discuss further evaluation of your symptoms.   BE SURE you are taking your blood pressure medications.   If your symptoms/condition worsens, please seek follow up care either here or in the ER. Please remember, our [facility] providers are "right here with you" when you need [place].   Again, it was my pleasure to take care of you today. Thank you for choosing our clinic. I hope that you start to feel better quickly.   Honor Loh, MSN, APRN, FNP-C, CEN Advanced Practice Provider [facility]

## 2019-09-28 NOTE — ED Triage Notes (Signed)
Patient states he is having trouble swallowing his food. He states he was here for this before in November. He feels like his food is getting stuck in his throat.

## 2019-10-04 ENCOUNTER — Ambulatory Visit
Admission: EM | Admit: 2019-10-04 | Discharge: 2019-10-04 | Disposition: A | Discharge status: Home / Self Care | Payer: BC Managed Care – PPO | Attending: Family Medicine | Admitting: Family Medicine

## 2019-10-04 ENCOUNTER — Other Ambulatory Visit: Payer: Self-pay

## 2019-10-04 DIAGNOSIS — R519 Headache, unspecified: Secondary | ICD-10-CM | POA: Insufficient documentation

## 2019-10-04 DIAGNOSIS — Z20822 Contact with and (suspected) exposure to covid-19: Secondary | ICD-10-CM | POA: Diagnosis not present

## 2019-10-04 NOTE — Discharge Instructions (Signed)
Results available in 24 to 48 hours.  Stay home.  Take care  Dr. Lacinda Axon

## 2019-10-04 NOTE — ED Provider Notes (Signed)
[facility]    CSN: [id number] Arrival date & time: 10/04/19  1145  History   Chief Complaint Chief Complaint  Patient presents with  . COVID exposure   HPI  54 year old male presents with the above complaint.  Patient daughter recently tested positive for COVID-19.  Patient states that he has had a slight headache the past few days.  He states that he has no other symptoms.  No cough, fever, chills, shortness of breath.  No respiratory symptoms.  He states that he is otherwise feeling well.  Desires Covid testing today.  His wife is also been mildly ill and she is accompanying him today.  No other complaints or concerns at this time.  Past Medical History:  Diagnosis Date  . Arthritis    right knee  . Family history of adverse reaction to anesthesia    son dx'd with malignant hyperthermia 3 yrs ago (after jaw surgery)  . Hypertension     Patient Active Problem List   Diagnosis Date Noted  . HTN (hypertension) 05/29/2016    Past Surgical History:  Procedure Laterality Date  . COLONOSCOPY WITH PROPOFOL N/A 07/30/2016   Procedure: COLONOSCOPY WITH PROPOFOL;  Surgeon: Jonathon Bellows, MD;  Location: [facility] ENDOSCOPY;  Service: Endoscopy;  Laterality: N/A;  . ligament repair Right   . NO PAST SURGERIES         Home Medications    Prior to Admission medications   Medication Sig Start Date End Date Taking? Authorizing Provider  amLODipine (NORVASC) 5 MG tablet Take 1 tablet (5 mg total) by mouth daily. 07/18/19 07/17/20 Yes Karen Kitchens, NP  lisinopril (ZESTRIL) 10 MG tablet Take 1 tablet (10 mg total) by mouth 2 (two) times daily. 07/18/19  Yes Karen Kitchens, NP  omeprazole (PRILOSEC) 20 MG capsule Take 1 capsule (20 mg total) by mouth daily. 09/28/19  Yes Karen Kitchens, NP    Family History Family History  Problem Relation Age of Onset  . Healthy Mother   . Healthy Father     Social History Social History   Tobacco Use  . Smoking status: Never Smoker  .  Smokeless tobacco: Never Used  Substance Use Topics  . Alcohol use: No  . Drug use: No     Allergies   Patient has no known allergies.   Review of Systems Review of Systems  Constitutional: Negative.   HENT: Negative.   Respiratory: Negative.   Neurological: Positive for headaches.    Physical Exam Triage Vital Signs ED Triage Vitals  Enc Vitals Group     BP 10/04/19 1227 (!) 180/101     Pulse Rate 10/04/19 1227 81     Resp --      Temp 10/04/19 1227 98.5 F (36.9 C)     Temp Source 10/04/19 1227 Oral     SpO2 10/04/19 1227 97 %     Weight 10/04/19 1225 200 lb (90.7 kg)     Height 10/04/19 1225 5\' 11"  (1.803 m)     Head Circumference --      Peak Flow --      Pain Score 10/04/19 1241 0     Pain Loc --      Pain Edu? --      Excl. in [place]? --    Updated Vital Signs BP (!) 180/101 (BP Location: Left Arm)   Pulse 81   Temp 98.5 F (36.9 C) (Oral)   Ht 5\' 11"  (1.803 m)   Wt  90.7 kg   SpO2 97%   BMI 27.89 kg/m   Visual Acuity Right Eye Distance:   Left Eye Distance:   Bilateral Distance:    Right Eye Near:   Left Eye Near:    Bilateral Near:     Physical Exam Vitals and nursing note reviewed.  Constitutional:      General: He is not in acute distress.    Appearance: Normal appearance. He is not ill-appearing.  HENT:     Head: Normocephalic and atraumatic.  Eyes:     General:        Right eye: No discharge.        Left eye: No discharge.     Conjunctiva/sclera: Conjunctivae normal.  Cardiovascular:     Rate and Rhythm: Normal rate and regular rhythm.     Heart sounds: No murmur.  Pulmonary:     Effort: Pulmonary effort is normal.     Breath sounds: Normal breath sounds. No wheezing, rhonchi or rales.  Neurological:     Mental Status: He is alert.  Psychiatric:        Mood and Affect: Mood normal.        Behavior: Behavior normal.    UC Treatments / Results  Labs (all labs ordered are listed, but only abnormal results are displayed) Labs  Reviewed  NOVEL CORONAVIRUS, NAA (HOSP ORDER, SEND-OUT TO REF LAB; TAT 18-24 HRS)    EKG   Radiology No results found.  Procedures Procedures (including critical care time)  Medications Ordered in UC Medications - No data to display  Initial Impression / Assessment and Plan / UC Course  I have reviewed the triage vital signs and the nursing notes.  Pertinent labs & imaging results that were available during my care of the patient were reviewed by me and considered in my medical decision making (see chart for details).    54 year old male presents with recent Covid exposure.   No acute, uncomplicated illness.  Possible COVID-19.  Currently symptomatic with headache.  No other symptoms.  Awaiting test results.  Supportive care.  Final Clinical Impressions(s) / UC Diagnoses   Final diagnoses:  Acute nonintractable headache, unspecified headache type  Encounter for laboratory testing for COVID-19 virus     Discharge Instructions     Results available in 24 to 48 hours.  Stay home.  Take care  Dr. Lacinda Axon     ED Prescriptions    None     PDMP not reviewed this encounter.   Coral Spikes, [place] 10/04/19 1259

## 2019-10-04 NOTE — ED Triage Notes (Signed)
Pt presents with c/o positive COVID exposure to his daughter who lives with him, she tested positive this past Friday. Pt reports a slight headache for a couple of days. Pt denies cough, shob, fever/chills or other symptoms.

## 2019-10-05 LAB — NOVEL CORONAVIRUS, NAA (HOSP ORDER, SEND-OUT TO REF LAB; TAT 18-24 HRS): SARS-CoV-2, NAA: NOT DETECTED

## 2019-10-16 ENCOUNTER — Other Ambulatory Visit: Payer: Self-pay

## 2019-10-16 ENCOUNTER — Encounter: Payer: Self-pay | Admitting: Gastroenterology

## 2019-10-16 ENCOUNTER — Encounter: Payer: Self-pay | Admitting: *Deleted

## 2019-10-16 ENCOUNTER — Ambulatory Visit (INDEPENDENT_AMBULATORY_CARE_PROVIDER_SITE_OTHER): Payer: BC Managed Care – PPO | Admitting: Gastroenterology

## 2019-10-16 VITALS — BP 155/107 | HR 79 | Temp 98.4°F | Ht 71.0 in | Wt 204.2 lb

## 2019-10-16 DIAGNOSIS — R1310 Dysphagia, unspecified: Secondary | ICD-10-CM | POA: Diagnosis not present

## 2019-10-16 DIAGNOSIS — S63659A Sprain of metacarpophalangeal joint of unspecified finger, initial encounter: Secondary | ICD-10-CM | POA: Insufficient documentation

## 2019-10-16 DIAGNOSIS — S62613A Displaced fracture of proximal phalanx of left middle finger, initial encounter for closed fracture: Secondary | ICD-10-CM | POA: Insufficient documentation

## 2019-10-16 DIAGNOSIS — S62309A Unspecified fracture of unspecified metacarpal bone, initial encounter for closed fracture: Secondary | ICD-10-CM | POA: Insufficient documentation

## 2019-10-16 MED ORDER — PANTOPRAZOLE SODIUM 40 MG PO TBEC: 40.0000 mg | DELAYED_RELEASE_TABLET | Freq: Every day | ORAL | 6 refills | Status: DC

## 2019-10-16 NOTE — H&P (View-Only) (Signed)
Gastroenterology Consultation  Referring Provider:     Karen Kitchens, NP Primary Care Physician:  Patient, No Pcp Per Primary Gastroenterologist:  Dr. Allen Norris     Reason for Consultation:     Sore throat and dysphagia        HPI:   Robert Suarez is a 54 y.o. y/o male referred for consultation & management of sore throat and dysphagia by Dr. Patient, No Pcp Per.  This patient comes in today after being seen in the past by Dr. Vicente Males for a colonoscopy.  The patient had a hyperplastic polyp at that time.  That was done in November 2017.  He went to the urgent care department on 14 January with a report of a sore throat with painful swallowing.  The patient does report that he had a history of heartburn but did not notice any difference when he was taking medication for it therefore he stopped taking the medication.  He now reports that he feels like food gets hung up when he tries to eat.  The patient was started on a PPI in urgent care at this last visit and then referred to GI for further evaluation. The patient also reports that for many years he has had episodes of having to move his bowels 30 minutes after eating.  There has been no report of any weight loss nausea vomiting black stools or bloody stools. The patient reports a fullness feeling in his throat and he states that after he takes his morning pills he feels like the pills are still stuck in his throat for some time after taking them.  Past Medical History:  Diagnosis Date  . Arthritis    right knee  . Family history of adverse reaction to anesthesia    son dx'd with malignant hyperthermia 3 yrs ago (after jaw surgery)  . Hypertension     Past Surgical History:  Procedure Laterality Date  . COLONOSCOPY WITH PROPOFOL N/A 07/30/2016   Procedure: COLONOSCOPY WITH PROPOFOL;  Surgeon: Jonathon Bellows, MD;  Location: [facility] ENDOSCOPY;  Service: Endoscopy;  Laterality: N/A;  . ligament repair Right   . NO PAST SURGERIES      Prior to  Admission medications   Medication Sig Start Date End Date Taking? Authorizing Provider  amLODipine (NORVASC) 5 MG tablet Take 1 tablet (5 mg total) by mouth daily. 07/18/19 07/17/20  Karen Kitchens, NP  lisinopril (ZESTRIL) 10 MG tablet Take 1 tablet (10 mg total) by mouth 2 (two) times daily. 07/18/19   Karen Kitchens, NP  omeprazole (PRILOSEC) 20 MG capsule Take 1 capsule (20 mg total) by mouth daily. 09/28/19   Karen Kitchens, NP    Family History  Problem Relation Age of Onset  . Healthy Mother   . Healthy Father      Social History   Tobacco Use  . Smoking status: Never Smoker  . Smokeless tobacco: Never Used  Substance Use Topics  . Alcohol use: No  . Drug use: No    Allergies as of 10/16/2019  . (No Known Allergies)    Review of Systems:    All systems reviewed and negative except where noted in HPI.   Physical Exam:  There were no vitals taken for this visit. No LMP for male patient. General:   Alert,  Well-developed, well-nourished, pleasant and cooperative in NAD Head:  Normocephalic and atraumatic. Eyes:  Sclera clear, no icterus.   Conjunctiva pink. Ears:  Normal auditory acuity. Neck:  Supple; no masses or thyromegaly. Lungs:  Respirations even and unlabored.  Clear throughout to auscultation.   No wheezes, crackles, or rhonchi. No acute distress. Heart:  Regular rate and rhythm; no murmurs, clicks, rubs, or gallops. Abdomen:  Normal bowel sounds.  No bruits.  Soft, non-tender and non-distended without masses, hepatosplenomegaly or hernias noted.  No guarding or rebound tenderness.  Negative Carnett sign.   Rectal:  Deferred.  Pulses:  Normal pulses noted. Extremities:  No clubbing or edema.  No cyanosis. Neurologic:  Alert and oriented x3;  grossly normal neurologically. Skin:  Intact without significant lesions or rashes.  No jaundice. Lymph Nodes:  No significant cervical adenopathy. Psych:  Alert and cooperative. Normal mood and affect.  Imaging  Studies: No results found.  Assessment and Plan:   Robert Suarez is a 54 y.o. y/o male who comes in today with dysphagia and atypical chest pain.  The patient has not been helped with his PPI and will be switched to pantoprazole.  The patient will take his pantoprazole in the evening.  The patient will also be set up for an upper endoscopy to look for any strictures or narrowing as the cause of his dysphagia to pills and as he reports breads and meats.  The patient's longstanding needs to go to the bathroom after he eats is likely related to irritable bowel syndrome.  The patient's wife today reports that his symptoms are much worse when he is under stress.  The patient has been explained the plan and agrees with it.    Lucilla Lame, MD. Marval Regal    Note: This dictation was prepared with Dragon dictation along with smaller phrase technology. Any transcriptional errors that result from this process are unintentional.

## 2019-10-16 NOTE — Progress Notes (Signed)
Gastroenterology Consultation  Referring Provider:     Karen Kitchens, NP Primary Care Physician:  Patient, No Pcp Per Primary Gastroenterologist:  Dr. Allen Norris     Reason for Consultation:     Sore throat and dysphagia        HPI:   Robert Suarez is a 54 y.o. y/o male referred for consultation & management of sore throat and dysphagia by Dr. Patient, No Pcp Per.  This patient comes in today after being seen in the past by Dr. Vicente Males for a colonoscopy.  The patient had a hyperplastic polyp at that time.  That was done in November 2017.  He went to the urgent care department on 14 January with a report of a sore throat with painful swallowing.  The patient does report that he had a history of heartburn but did not notice any difference when he was taking medication for it therefore he stopped taking the medication.  He now reports that he feels like food gets hung up when he tries to eat.  The patient was started on a PPI in urgent care at this last visit and then referred to GI for further evaluation. The patient also reports that for many years he has had episodes of having to move his bowels 30 minutes after eating.  There has been no report of any weight loss nausea vomiting black stools or bloody stools. The patient reports a fullness feeling in his throat and he states that after he takes his morning pills he feels like the pills are still stuck in his throat for some time after taking them.  Past Medical History:  Diagnosis Date  . Arthritis    right knee  . Family history of adverse reaction to anesthesia    son dx'd with malignant hyperthermia 3 yrs ago (after jaw surgery)  . Hypertension     Past Surgical History:  Procedure Laterality Date  . COLONOSCOPY WITH PROPOFOL N/A 07/30/2016   Procedure: COLONOSCOPY WITH PROPOFOL;  Surgeon: Jonathon Bellows, MD;  Location: [facility] ENDOSCOPY;  Service: Endoscopy;  Laterality: N/A;  . ligament repair Right   . NO PAST SURGERIES      Prior to  Admission medications   Medication Sig Start Date End Date Taking? Authorizing Provider  amLODipine (NORVASC) 5 MG tablet Take 1 tablet (5 mg total) by mouth daily. 07/18/19 07/17/20  Karen Kitchens, NP  lisinopril (ZESTRIL) 10 MG tablet Take 1 tablet (10 mg total) by mouth 2 (two) times daily. 07/18/19   Karen Kitchens, NP  omeprazole (PRILOSEC) 20 MG capsule Take 1 capsule (20 mg total) by mouth daily. 09/28/19   Karen Kitchens, NP    Family History  Problem Relation Age of Onset  . Healthy Mother   . Healthy Father      Social History   Tobacco Use  . Smoking status: Never Smoker  . Smokeless tobacco: Never Used  Substance Use Topics  . Alcohol use: No  . Drug use: No    Allergies as of 10/16/2019  . (No Known Allergies)    Review of Systems:    All systems reviewed and negative except where noted in HPI.   Physical Exam:  There were no vitals taken for this visit. No LMP for male patient. General:   Alert,  Well-developed, well-nourished, pleasant and cooperative in NAD Head:  Normocephalic and atraumatic. Eyes:  Sclera clear, no icterus.   Conjunctiva pink. Ears:  Normal auditory acuity. Neck:  Supple; no masses or thyromegaly. Lungs:  Respirations even and unlabored.  Clear throughout to auscultation.   No wheezes, crackles, or rhonchi. No acute distress. Heart:  Regular rate and rhythm; no murmurs, clicks, rubs, or gallops. Abdomen:  Normal bowel sounds.  No bruits.  Soft, non-tender and non-distended without masses, hepatosplenomegaly or hernias noted.  No guarding or rebound tenderness.  Negative Carnett sign.   Rectal:  Deferred.  Pulses:  Normal pulses noted. Extremities:  No clubbing or edema.  No cyanosis. Neurologic:  Alert and oriented x3;  grossly normal neurologically. Skin:  Intact without significant lesions or rashes.  No jaundice. Lymph Nodes:  No significant cervical adenopathy. Psych:  Alert and cooperative. Normal mood and affect.  Imaging Studies:  No results found.  Assessment and Plan:   Robert Suarez is a 54 y.o. y/o male who comes in today with dysphagia and atypical chest pain.  The patient has not been helped with his PPI and will be switched to pantoprazole.  The patient will take his pantoprazole in the evening.  The patient will also be set up for an upper endoscopy to look for any strictures or narrowing as the cause of his dysphagia to pills and as he reports breads and meats.  The patient's longstanding needs to go to the bathroom after he eats is likely related to irritable bowel syndrome.  The patient's wife today reports that his symptoms are much worse when he is under stress.  The patient has been explained the plan and agrees with it.    Lucilla Lame, MD. Marval Regal    Note: This dictation was prepared with Dragon dictation along with smaller phrase technology. Any transcriptional errors that result from this process are unintentional.

## 2019-10-18 ENCOUNTER — Other Ambulatory Visit: Payer: BC Managed Care – PPO

## 2019-10-19 ENCOUNTER — Other Ambulatory Visit
Admission: RE | Admit: 2019-10-19 | Discharge: 2019-10-19 | Disposition: A | Discharge status: Home / Self Care | Payer: BC Managed Care – PPO | Source: Ambulatory Visit | Attending: Gastroenterology | Admitting: Gastroenterology

## 2019-10-19 ENCOUNTER — Other Ambulatory Visit: Payer: Self-pay

## 2019-10-19 DIAGNOSIS — Z01812 Encounter for preprocedural laboratory examination: Secondary | ICD-10-CM | POA: Insufficient documentation

## 2019-10-19 DIAGNOSIS — Z20822 Contact with and (suspected) exposure to covid-19: Secondary | ICD-10-CM | POA: Diagnosis not present

## 2019-10-19 LAB — SARS CORONAVIRUS 2 (TAT 6-24 HRS): SARS Coronavirus 2: NEGATIVE

## 2019-10-20 ENCOUNTER — Encounter: Payer: Self-pay | Admitting: Gastroenterology

## 2019-10-23 ENCOUNTER — Encounter
Admission: RE | Disposition: A | Discharge status: Home / Self Care | Payer: Self-pay | Source: Home / Self Care | Attending: Gastroenterology

## 2019-10-23 ENCOUNTER — Other Ambulatory Visit: Payer: Self-pay

## 2019-10-23 ENCOUNTER — Ambulatory Visit: Payer: BC Managed Care – PPO | Admitting: Anesthesiology

## 2019-10-23 ENCOUNTER — Encounter: Payer: Self-pay | Admitting: Gastroenterology

## 2019-10-23 ENCOUNTER — Ambulatory Visit
Admission: RE | Admit: 2019-10-23 | Discharge: 2019-10-23 | Disposition: A | Discharge status: Home / Self Care | Payer: BC Managed Care – PPO | Attending: Gastroenterology | Admitting: Gastroenterology

## 2019-10-23 DIAGNOSIS — Z79899 Other long term (current) drug therapy: Secondary | ICD-10-CM | POA: Insufficient documentation

## 2019-10-23 DIAGNOSIS — K449 Diaphragmatic hernia without obstruction or gangrene: Secondary | ICD-10-CM | POA: Diagnosis not present

## 2019-10-23 DIAGNOSIS — J029 Acute pharyngitis, unspecified: Secondary | ICD-10-CM | POA: Insufficient documentation

## 2019-10-23 DIAGNOSIS — K317 Polyp of stomach and duodenum: Secondary | ICD-10-CM | POA: Diagnosis not present

## 2019-10-23 DIAGNOSIS — Z8489 Family history of other specified conditions: Secondary | ICD-10-CM | POA: Insufficient documentation

## 2019-10-23 DIAGNOSIS — I10 Essential (primary) hypertension: Secondary | ICD-10-CM | POA: Insufficient documentation

## 2019-10-23 DIAGNOSIS — R1310 Dysphagia, unspecified: Secondary | ICD-10-CM

## 2019-10-23 DIAGNOSIS — D131 Benign neoplasm of stomach: Secondary | ICD-10-CM | POA: Diagnosis not present

## 2019-10-23 DIAGNOSIS — K222 Esophageal obstruction: Secondary | ICD-10-CM

## 2019-10-23 HISTORY — PX: ESOPHAGOGASTRODUODENOSCOPY (EGD) WITH PROPOFOL: SHX5813

## 2019-10-23 SURGERY — ESOPHAGOGASTRODUODENOSCOPY (EGD) WITH PROPOFOL
Anesthesia: General

## 2019-10-23 MED ORDER — LIDOCAINE HCL (PF) 2 % IJ SOLN
INTRAMUSCULAR | Status: DC | PRN
  Administered 2019-10-23: 100 mg via INTRADERMAL

## 2019-10-23 MED ORDER — PROPOFOL 10 MG/ML IV BOLUS
INTRAVENOUS | Status: DC | PRN
  Administered 2019-10-23: 100 mg via INTRAVENOUS

## 2019-10-23 MED ORDER — SODIUM CHLORIDE 0.9 % IV SOLN
INTRAVENOUS | Status: DC
  Administered 2019-10-23: 09:00:00 1000 mL via INTRAVENOUS

## 2019-10-23 MED ORDER — PROPOFOL 500 MG/50ML IV EMUL
INTRAVENOUS | Status: DC | PRN
Start: 2019-10-23 — End: 2019-10-23
  Administered 2019-10-23: 175 ug/kg/min via INTRAVENOUS

## 2019-10-23 NOTE — Op Note (Signed)
[facility] Gastroenterology Patient Name: Robert Suarez Procedure Date: 10/23/2019 9:20 AM MRN: [id number] Account #: [id number] Date of Birth: February 25, 1966 Admit Type: Outpatient Age: 54 Room: [facility] ENDO ROOM 4 Gender: Male Note Status: Finalized Procedure:             Upper GI endoscopy Indications:           Dysphagia Providers:             Lucilla Lame MD, MD Referring MD:          No Local Md, MD (Referring MD) Medicines:             Propofol per Anesthesia Complications:         No immediate complications. Procedure:             Pre-Anesthesia Assessment:                        - Prior to the procedure, a History and Physical was                         performed, and patient medications and allergies were                         reviewed. The patient's tolerance of previous                         anesthesia was also reviewed. The risks and benefits                         of the procedure and the sedation options and risks                         were discussed with the patient. All questions were                         answered, and informed consent was obtained. Prior                         Anticoagulants: The patient has taken no previous                         anticoagulant or antiplatelet agents. ASA Grade                         Assessment: II - A patient with mild systemic disease.                         After reviewing the risks and benefits, the patient                         was deemed in satisfactory condition to undergo the                         procedure.                        After obtaining informed consent, the endoscope was  passed under direct vision. Throughout the procedure,                         the patient's blood pressure, pulse, and oxygen                         saturations were monitored continuously. The Endoscope                         was introduced through the mouth, and advanced to the                second part of duodenum. The upper GI endoscopy was                         accomplished without difficulty. The patient tolerated                         the procedure well. Findings:      A small hiatal hernia was present.      One benign-appearing, intrinsic mild stenosis was found at the       gastroesophageal junction. The stenosis was traversed. A TTS dilator was       passed through the scope. Dilation with a 12-13.5-15 mm balloon dilator       was performed to 15 mm. The dilation site was examined following       endoscope reinsertion and showed moderate improvement in luminal       narrowing.      A few 3 mm sessile polyps with no bleeding and no stigmata of recent       bleeding were found in the gastric fundus. Biopsies were taken with a       cold forceps for histology.      The examined duodenum was normal.      Two biopsies were obtained with cold forceps for histology in the middle       third of the esophagus. Impression:            - Small hiatal hernia.                        - Benign-appearing esophageal stenosis. Dilated.                        - A few gastric polyps. Biopsied.                        - Normal examined duodenum.                        - Biopsy performed in the middle third of the                         esophagus. Recommendation:        - Discharge patient to home.                        - Resume previous diet.                        - Continue present medications.                        -  Await pathology results. Procedure Code(s):     --- Professional ---                        [phone], Esophagogastroduodenoscopy, flexible,                         transoral; with transendoscopic balloon dilation of                         esophagus (less than 30 mm diameter)                        43239, 59, Esophagogastroduodenoscopy, flexible,                         transoral; with biopsy, single or multiple CPT copyright 2019 American Medical  Association. All rights reserved. The codes documented in this report are preliminary and upon coder review may  be revised to meet current compliance requirements. Lucilla Lame MD, MD 10/23/2019 9:36:32 AM This report has been signed electronically. Number of Addenda: 0 Note Initiated On: 10/23/2019 9:20 AM Estimated Blood Loss:  Estimated blood loss: none.      [facility]

## 2019-10-23 NOTE — Interval H&P Note (Signed)
History and Physical Interval Note:  10/23/2019 8:40 AM  Robert Suarez  has presented today for surgery, with the diagnosis of Dysphagia R13.10.  The various methods of treatment have been discussed with the patient and family. After consideration of risks, benefits and other options for treatment, the patient has consented to  Procedure(s): ESOPHAGOGASTRODUODENOSCOPY (EGD) WITH PROPOFOL (N/A) as a surgical intervention.  The patient's history has been reviewed, patient examined, no change in status, stable for surgery.  I have reviewed the patient's chart and labs.  Questions were answered to the patient's satisfaction.     Robert Suarez [employer]

## 2019-10-23 NOTE — Anesthesia Procedure Notes (Signed)
Date/Time: 10/23/2019 8:35 AM Performed by: Nelda Marseille, CRNA Pre-anesthesia Checklist: Patient identified, Emergency Drugs available, Suction available, Patient being monitored and Timeout performed Oxygen Delivery Method: Nasal cannula

## 2019-10-23 NOTE — Transfer of Care (Signed)
Immediate Anesthesia Transfer of Care Note  Patient: Robert Suarez  Procedure(s) Performed: ESOPHAGOGASTRODUODENOSCOPY (EGD) WITH PROPOFOL (N/A )  Patient Location: PACU  Anesthesia Type:General  Level of Consciousness: awake and sedated  Airway & Oxygen Therapy: Patient Spontanous Breathing and Patient connected to nasal cannula oxygen  Post-op Assessment: Report given to RN and Post -op Vital signs reviewed and stable  Post vital signs: Reviewed and stable  Last Vitals:  Vitals Value Taken Time  BP 176/104 10/23/19 0940  Temp 36.9 C 10/23/19 0937  Pulse 115 10/23/19 0940  Resp 17 10/23/19 0940  SpO2 97 % 10/23/19 0940  Vitals shown include unvalidated device data.  Last Pain:  Vitals:   10/23/19 0937  TempSrc: Temporal  PainSc:          Complications: No apparent anesthesia complications

## 2019-10-23 NOTE — Anesthesia Preprocedure Evaluation (Signed)
Anesthesia Evaluation  Patient identified by MRN, date of birth, ID band Patient awake    Reviewed: Allergy & Precautions, H&P , NPO status , Patient's Chart, lab work & pertinent test results, reviewed documented beta blocker date and time   Airway Mallampati: II   Neck ROM: full    Dental  (+) Poor Dentition   Pulmonary neg pulmonary ROS,    Pulmonary exam normal        Cardiovascular Exercise Tolerance: Good hypertension, On Medications negative cardio ROS Normal cardiovascular exam Rhythm:regular Rate:Normal     Neuro/Psych negative neurological ROS  negative psych ROS   GI/Hepatic negative GI ROS, Neg liver ROS,   Endo/Other  negative endocrine ROS  Renal/GU negative Renal ROS  negative genitourinary   Musculoskeletal   Abdominal   Peds  Hematology negative hematology ROS (+)   Anesthesia Other Findings Past Medical History: No date: Arthritis     Comment:  right knee No date: Family history of adverse reaction to anesthesia     Comment:  son dx'd with malignant hyperthermia 3 yrs ago (after               jaw surgery) No date: Hypertension Past Surgical History: 07/30/2016: COLONOSCOPY WITH PROPOFOL; N/A     Comment:  Procedure: COLONOSCOPY WITH PROPOFOL;  Surgeon: Jonathon Bellows, MD;  Location: [facility] ENDOSCOPY;  Service: Endoscopy;              Laterality: N/A; No date: ligament repair; Right No date: NO PAST SURGERIES   Reproductive/Obstetrics negative OB ROS                             Anesthesia Physical Anesthesia Plan  ASA: II  Anesthesia Plan: General   Post-op Pain Management:    Induction:   PONV Risk Score and Plan:   Airway Management Planned:   Additional Equipment:   Intra-op Plan:   Post-operative Plan:   Informed Consent: I have reviewed the patients History and Physical, chart, labs and discussed the procedure including the risks,  benefits and alternatives for the proposed anesthesia with the patient or authorized representative who has indicated his/her understanding and acceptance.     Dental Advisory Given  Plan Discussed with: CRNA  Anesthesia Plan Comments:         Anesthesia Quick Evaluation

## 2019-10-24 ENCOUNTER — Encounter: Payer: Self-pay | Admitting: *Deleted

## 2019-10-24 LAB — SURGICAL PATHOLOGY

## 2019-10-24 NOTE — Anesthesia Postprocedure Evaluation (Signed)
Anesthesia Post Note  Patient: Robert Suarez  Procedure(s) Performed: ESOPHAGOGASTRODUODENOSCOPY (EGD) WITH PROPOFOL (N/A )  Patient location during evaluation: Endoscopy Anesthesia Type: General Level of consciousness: awake and alert Pain management: pain level controlled Vital Signs Assessment: post-procedure vital signs reviewed and stable Respiratory status: spontaneous breathing, nonlabored ventilation, respiratory function stable and patient connected to nasal cannula oxygen Cardiovascular status: blood pressure returned to baseline and stable Postop Assessment: no apparent nausea or vomiting Anesthetic complications: no     Last Vitals:  Vitals:   10/23/19 0947 10/23/19 1007  BP: (!) 167/108 (!) 158/107  Pulse:    Resp:    Temp:    SpO2:      Last Pain:  Vitals:   10/23/19 1007  TempSrc:   PainSc: 0-No pain                 Martha Clan

## 2019-11-16 ENCOUNTER — Telehealth: Payer: Self-pay

## 2019-11-16 NOTE — Telephone Encounter (Signed)
Mychart message sent to pt regarding results and follow up appt.

## 2019-11-16 NOTE — Telephone Encounter (Signed)
-----   Message from Lucilla Lame, MD sent at 10/26/2019 11:26 AM EST ----- Please have the patient come in for a follow up.

## 2019-11-21 ENCOUNTER — Telehealth: Payer: Self-pay

## 2019-11-21 NOTE — Telephone Encounter (Signed)
-----   Message from Lucilla Lame, MD sent at 10/26/2019 11:26 AM EST ----- Please have the patient come in for a follow up.

## 2019-11-21 NOTE — Telephone Encounter (Signed)
Unable to reach pt to schedule follow up appt. Mychart messages have been sent and read by pt.

## 2020-03-19 ENCOUNTER — Other Ambulatory Visit: Payer: Self-pay

## 2020-03-19 ENCOUNTER — Emergency Department: Payer: BC Managed Care – PPO

## 2020-03-19 ENCOUNTER — Emergency Department
Admission: EM | Admit: 2020-03-19 | Discharge: 2020-03-19 | Disposition: A | Discharge status: Home / Self Care | Payer: BC Managed Care – PPO | Source: Home / Self Care | Attending: Emergency Medicine | Admitting: Emergency Medicine

## 2020-03-19 DIAGNOSIS — N12 Tubulo-interstitial nephritis, not specified as acute or chronic: Secondary | ICD-10-CM | POA: Insufficient documentation

## 2020-03-19 DIAGNOSIS — I10 Essential (primary) hypertension: Secondary | ICD-10-CM | POA: Insufficient documentation

## 2020-03-19 DIAGNOSIS — A419 Sepsis, unspecified organism: Secondary | ICD-10-CM | POA: Diagnosis not present

## 2020-03-19 DIAGNOSIS — R10823 Right lower quadrant rebound abdominal tenderness: Secondary | ICD-10-CM | POA: Insufficient documentation

## 2020-03-19 DIAGNOSIS — Z79899 Other long term (current) drug therapy: Secondary | ICD-10-CM | POA: Insufficient documentation

## 2020-03-19 DIAGNOSIS — J029 Acute pharyngitis, unspecified: Secondary | ICD-10-CM | POA: Insufficient documentation

## 2020-03-19 HISTORY — DX: Gastro-esophageal reflux disease without esophagitis: K21.9

## 2020-03-19 LAB — COMPREHENSIVE METABOLIC PANEL
ALT: 23 U/L (ref 0–44)
AST: 22 U/L (ref 15–41)
Albumin: 4.4 g/dL (ref 3.5–5.0)
Alkaline Phosphatase: 68 U/L (ref 38–126)
Anion gap: 14 (ref 5–15)
BUN: 12 mg/dL (ref 6–20)
CO2: 24 mmol/L (ref 22–32)
Calcium: 9.1 mg/dL (ref 8.9–10.3)
Chloride: 100 mmol/L (ref 98–111)
Creatinine, Ser: 1.16 mg/dL (ref 0.61–1.24)
GFR calc Af Amer: >60 mL/min (ref >60)
GFR calc non Af Amer: >60 mL/min (ref >60)
Glucose, Bld: 119 mg/dL — ABNORMAL HIGH (ref 70–99)
Potassium: 4 mmol/L (ref 3.5–5.1)
Sodium: 138 mmol/L (ref 135–145)
Total Bilirubin: 1.2 mg/dL (ref 0.3–1.2)
Total Protein: 7.3 g/dL (ref 6.5–8.1)

## 2020-03-19 LAB — CBC WITH DIFFERENTIAL/PLATELET
Abs Immature Granulocytes: 0.08 10*3/uL — ABNORMAL HIGH (ref 0.00–0.07)
Basophils Absolute: 0 10*3/uL (ref 0.0–0.1)
Basophils Relative: 0 %
Eosinophils Absolute: 0 10*3/uL (ref 0.0–0.5)
Eosinophils Relative: 0 %
HCT: 43.3 % (ref 39.0–52.0)
Hemoglobin: 15.3 g/dL (ref 13.0–17.0)
Immature Granulocytes: 1 %
Lymphocytes Relative: 6 %
Lymphs Abs: 0.9 10*3/uL (ref 0.7–4.0)
MCH: 33 pg (ref 26.0–34.0)
MCHC: 35.3 g/dL (ref 30.0–36.0)
MCV: 93.5 fL (ref 80.0–100.0)
Monocytes Absolute: 1.6 10*3/uL — ABNORMAL HIGH (ref 0.1–1.0)
Monocytes Relative: 11 %
Neutro Abs: 12.5 10*3/uL — ABNORMAL HIGH (ref 1.7–7.7)
Neutrophils Relative %: 82 %
Platelets: 221 10*3/uL (ref 150–400)
RBC: 4.63 MIL/uL (ref 4.22–5.81)
RDW: 12.9 % (ref 11.5–15.5)
WBC: 15.1 10*3/uL — ABNORMAL HIGH (ref 4.0–10.5)
nRBC: 0 % (ref 0.0–0.2)

## 2020-03-19 LAB — TROPONIN I (HIGH SENSITIVITY)
Troponin I (High Sensitivity): 9 ng/L (ref <18)
Troponin I (High Sensitivity): 8 ng/L (ref <18)

## 2020-03-19 LAB — URINALYSIS, COMPLETE (UACMP) WITH MICROSCOPIC
Bacteria, UA: NONE SEEN
Bilirubin Urine: NEGATIVE
Glucose, UA: NEGATIVE mg/dL
Hgb urine dipstick: NEGATIVE
Ketones, ur: NEGATIVE mg/dL
Leukocytes,Ua: NEGATIVE
Nitrite: NEGATIVE
Protein, ur: 30 mg/dL — AB
Specific Gravity, Urine: 1.028 (ref 1.005–1.030)
Squamous Epithelial / HPF: NONE SEEN (ref 0–5)
pH: 5 (ref 5.0–8.0)

## 2020-03-19 LAB — PROTIME-INR
INR: 1.1 (ref 0.8–1.2)
Prothrombin Time: 13.6 seconds (ref 11.4–15.2)

## 2020-03-19 LAB — LIPASE, BLOOD: Lipase: 26 U/L (ref 11–51)

## 2020-03-19 LAB — LACTIC ACID, PLASMA: Lactic Acid, Venous: 1.6 mmol/L (ref 0.5–1.9)

## 2020-03-19 LAB — GROUP A STREP BY PCR: Group A Strep by PCR: NOT DETECTED

## 2020-03-19 MED ORDER — SODIUM CHLORIDE 0.9 % IV BOLUS
1000.0000 mL | Freq: Once | INTRAVENOUS | Status: AC
  Administered 2020-03-19: 1000 mL via INTRAVENOUS

## 2020-03-19 MED ORDER — ACETAMINOPHEN 500 MG PO TABS
ORAL_TABLET | ORAL | Status: AC
  Filled 2020-03-19: qty 2

## 2020-03-19 MED ORDER — SODIUM CHLORIDE 0.9 % IV SOLN
1.0000 g | Freq: Once | INTRAVENOUS | Status: AC
  Administered 2020-03-19: 1 g via INTRAVENOUS
  Filled 2020-03-19: qty 10

## 2020-03-19 MED ORDER — ACETAMINOPHEN 500 MG PO TABS
1000.0000 mg | ORAL_TABLET | Freq: Once | ORAL | Status: AC
  Administered 2020-03-19: 1000 mg via ORAL

## 2020-03-19 MED ORDER — CEPHALEXIN 500 MG PO CAPS: 500.0000 mg | ORAL_CAPSULE | Freq: Four times a day (QID) | ORAL | 0 refills | Status: DC

## 2020-03-19 NOTE — ED Triage Notes (Signed)
Patient c/o weakness, throat pain, and back pain. Patient reports 1 fall due to weakness yesterday. Patient had some nausea at home.

## 2020-03-19 NOTE — ED Notes (Signed)
E-signature not working at this time. Pt verbalized understanding of D/C instructions, prescriptions and follow up care with no further questions at this time. Pt in NAD and ambulatory at time of D/C.  

## 2020-03-19 NOTE — Discharge Instructions (Addendum)
Take the Keflex antibiotic 1 pill 4 times a day for your kidney infection.  Return if you are feeling worse or not any better in about 2 days.  That includes higher fever nausea worse weakness or any other problems.  Follow-up with urologist Dr. Erlene Quan.  Give her a call tomorrow to set up an appointment in about a week.  We need to find out why you got a kidney infection.  It is unusual for younger men to do that.  Use Tylenol or Advil as needed for fever and aches and pains.  Drink plenty of fluids.

## 2020-03-19 NOTE — ED Notes (Signed)
Called lab to check on blood specimens

## 2020-03-19 NOTE — ED Provider Notes (Signed)
[facility] Emergency Department Provider Note   ____________________________________________   First MD Initiated Contact with Patient 03/19/20 1048     (approximate)  I have reviewed the triage vital signs and the nursing notes.   HISTORY  Chief Complaint Weakness    HPI Robert Suarez is a 54 y.o. male patient complains of back pain in the CVA area overall weakness in the sore throat.  He got up yesterday and fell down because he was so weak.  He has had some nausea as well.  He is just generally weak and febrile feeling.  No vomiting or diarrhea.  Back pain is achy and again in the CVA area.  Is worse with percussion.         Past Medical History:  Diagnosis Date   Arthritis    right knee   Family history of adverse reaction to anesthesia    son dx'd with malignant hyperthermia 3 yrs ago (after jaw surgery)   GERD (gastroesophageal reflux disease)    Hypertension     Patient Active Problem List   Diagnosis Date Noted   Dysphagia    Esophageal stricture    Gastric polyp    Closed fracture of proximal phalanx of left middle finger 10/16/2019   Fracture of metacarpal bone 10/16/2019   Sprain of metacarpophalangeal joint 10/16/2019   HTN (hypertension) 05/29/2016    Past Surgical History:  Procedure Laterality Date   COLONOSCOPY WITH PROPOFOL N/A 07/30/2016   Procedure: COLONOSCOPY WITH PROPOFOL;  Surgeon: Jonathon Bellows, MD;  Location: [facility] ENDOSCOPY;  Service: Endoscopy;  Laterality: N/A;   ESOPHAGOGASTRODUODENOSCOPY (EGD) WITH PROPOFOL N/A 10/23/2019   Procedure: ESOPHAGOGASTRODUODENOSCOPY (EGD) WITH PROPOFOL;  Surgeon: Lucilla Lame, MD;  Location: [facility] ENDOSCOPY;  Service: Endoscopy;  Laterality: N/A;   ligament repair Right    NO PAST SURGERIES      Prior to Admission medications   Medication Sig Start Date End Date Taking? Authorizing Provider  amLODipine (NORVASC) 5 MG tablet Take 1 tablet (5 mg total) by mouth daily.  07/18/19 07/17/20 Yes Karen Kitchens, NP  lisinopril (ZESTRIL) 10 MG tablet Take 1 tablet (10 mg total) by mouth 2 (two) times daily. 07/18/19  Yes Karen Kitchens, NP  pantoprazole (PROTONIX) 40 MG tablet Take 1 tablet (40 mg total) by mouth daily. 10/16/19  Yes Lucilla Lame, MD  cephALEXin (KEFLEX) 500 MG capsule Take 1 capsule (500 mg total) by mouth 4 (four) times daily for 10 days. 03/19/20 03/29/20  Nena Polio, MD  omeprazole (PRILOSEC) 20 MG capsule Take 1 capsule (20 mg total) by mouth daily. Patient not taking: Reported on 10/16/2019 09/28/19   Karen Kitchens, NP    Allergies Patient has no known allergies.  Family History  Problem Relation Age of Onset   Healthy Mother    Healthy Father     Social History Social History   Tobacco Use   Smoking status: Never Smoker   Smokeless tobacco: Never Used  Scientific laboratory technician Use: Never used  Substance Use Topics   Alcohol use: No   Drug use: No    Review of Systems  Constitutional:  fever/chills Eyes: No visual changes. ENT: No sore throat. Cardiovascular: Denies chest pain. Respiratory: Denies shortness of breath. Gastrointestinal: No abdominal pain.  nausea, no vomiting.  No diarrhea.  No constipation. Genitourinary: Negative for dysuria. Musculoskeletal:back pain. Skin: Negative for rash. Neurological: Negative for headaches, focal weakness ____________________________________________   PHYSICAL EXAM:  VITAL SIGNS: ED  Triage Vitals  Enc Vitals Group     BP 03/19/20 0601 (!) 144/97     Pulse Rate 03/19/20 0601 (!) 109     Resp 03/19/20 0601 (!) 22     Temp 03/19/20 0601 (!) 100.5 F (38.1 C)     Temp Source 03/19/20 0601 Oral     SpO2 03/19/20 0601 98 %     Weight 03/19/20 0554 199 lb 15.3 oz (90.7 kg)     Height 03/19/20 0554 5\' 10"  (1.778 m)     Head Circumference --      Peak Flow --      Pain Score 03/19/20 0554 8     Pain Loc --      Pain Edu? --      Excl. in [place]? --     Constitutional: Alert  and oriented.  Looks tired and ill Eyes: Conjunctivae are normal.  Head: Atraumatic. Nose: No congestion/rhinnorhea. Mouth/Throat: Mucous membranes are moist.  Oropharynx non-erythematous. Neck: No stridor.  Cardiovascular: Normal rate, regular rhythm. Grossly normal heart sounds.  Good peripheral circulation. Respiratory: Normal respiratory effort.  No retractions. Lungs CTAB. Gastrointestinal: Soft and nontender. No distention. No abdominal bruits.  Bilateral CVA tenderness. Musculoskeletal: No lower extremity tenderness nor edema.   Neurologic:  Normal speech and language. No gross focal neurologic deficits are appreciated. No gait instability. Skin:  Skin is warm, dry and intact. No rash noted.   ____________________________________________   LABS (all labs ordered are listed, but only abnormal results are displayed)  Labs Reviewed  URINALYSIS, COMPLETE (UACMP) WITH MICROSCOPIC - Abnormal; Notable for the following components:      Result Value   Color, Urine AMBER (*)    APPearance HAZY (*)    Protein, ur 30 (*)    All other components within normal limits  COMPREHENSIVE METABOLIC PANEL - Abnormal; Notable for the following components:   Glucose, Bld 119 (*)    All other components within normal limits  CBC WITH DIFFERENTIAL/PLATELET - Abnormal; Notable for the following components:   WBC 15.1 (*)    Neutro Abs 12.5 (*)    Monocytes Absolute 1.6 (*)    Abs Immature Granulocytes 0.08 (*)    All other components within normal limits  GROUP A STREP BY PCR  CULTURE, BLOOD (ROUTINE X 2)  CULTURE, BLOOD (ROUTINE X 2)  LIPASE, BLOOD  LACTIC ACID, PLASMA  PROTIME-INR  TROPONIN I (HIGH SENSITIVITY)  TROPONIN I (HIGH SENSITIVITY)   ____________________________________________  EKG  Patient is sinus tachycardia at a rate of 109 normal axis nonspecific ST-T changes patient does not have pleuritic chest pain or shortness of breath.  Patient does have a S1Q3T3 but about half  the patient is that I had done CAT scans of the chest on with S1 Q 3 T3 have not had any pulmonary embolus I am not worried about pulmonary embolus clinically in this gentleman.  Again he has no pleuritic chest pain or shortness of breath. ____________________________________________  RADIOLOGY  ED MD interpretation: Chest x-ray read by radiology shows no acute disease  Official radiology report(s): DG Chest 2 View  Result Date: 03/19/2020 CLINICAL DATA:  Suspected sepsis. EXAM: CHEST - 2 VIEW COMPARISON:  No prior. FINDINGS: Mediastinum and hilar structures normal. Low lung volumes. No focal infiltrate. No pleural effusion or pneumothorax. No pleural effusion or pneumothorax. IMPRESSION: No acute cardiopulmonary disease. Electronically Signed   By: Marcello Moores  Register   On: 03/19/2020 08:01    ____________________________________________   PROCEDURES  Procedure(s) performed (including Critical Care):  Procedures   ____________________________________________   INITIAL IMPRESSION / ASSESSMENT AND PLAN / ED COURSE  Patient has a high white blood count with many PMNs.  He has white cells in his urine CVA tenderness and no fever.  These are all consistent with pyelonephritis.  I will give him Keflex 500 mg 4 times a day.  He will return if he is worse or not any better in 2 days.  He will follow up with his primary care doctor and urology.             ____________________________________________   FINAL CLINICAL IMPRESSION(S) / ED DIAGNOSES  Final diagnoses:  Pyelonephritis     ED Discharge Orders         Ordered    cephALEXin (KEFLEX) 500 MG capsule  4 times daily     Discontinue  Reprint     03/19/20 1301           Note:  This document was prepared using Dragon voice recognition software and may include unintentional dictation errors.    Nena Polio, MD 03/19/20 1308

## 2020-03-20 ENCOUNTER — Emergency Department: Payer: BC Managed Care – PPO

## 2020-03-20 ENCOUNTER — Inpatient Hospital Stay
Admission: EM | Admit: 2020-03-20 | Discharge: 2020-03-22 | DRG: 372 | Disposition: A | Discharge status: Home / Self Care | Payer: BC Managed Care – PPO | Attending: Hospitalist | Admitting: Hospitalist

## 2020-03-20 DIAGNOSIS — A419 Sepsis, unspecified organism: Secondary | ICD-10-CM | POA: Diagnosis not present

## 2020-03-20 DIAGNOSIS — Z20822 Contact with and (suspected) exposure to covid-19: Secondary | ICD-10-CM | POA: Diagnosis present

## 2020-03-20 DIAGNOSIS — A029 Salmonella infection, unspecified: Secondary | ICD-10-CM

## 2020-03-20 DIAGNOSIS — K529 Noninfective gastroenteritis and colitis, unspecified: Secondary | ICD-10-CM | POA: Diagnosis not present

## 2020-03-20 DIAGNOSIS — M1990 Unspecified osteoarthritis, unspecified site: Secondary | ICD-10-CM | POA: Diagnosis present

## 2020-03-20 DIAGNOSIS — K219 Gastro-esophageal reflux disease without esophagitis: Secondary | ICD-10-CM | POA: Diagnosis present

## 2020-03-20 DIAGNOSIS — N12 Tubulo-interstitial nephritis, not specified as acute or chronic: Secondary | ICD-10-CM | POA: Diagnosis present

## 2020-03-20 DIAGNOSIS — R531 Weakness: Secondary | ICD-10-CM | POA: Diagnosis not present

## 2020-03-20 DIAGNOSIS — Z9181 History of falling: Secondary | ICD-10-CM

## 2020-03-20 DIAGNOSIS — R000 Tachycardia, unspecified: Secondary | ICD-10-CM | POA: Diagnosis not present

## 2020-03-20 DIAGNOSIS — Z8719 Personal history of other diseases of the digestive system: Secondary | ICD-10-CM

## 2020-03-20 DIAGNOSIS — A020 Salmonella enteritis: Principal | ICD-10-CM | POA: Diagnosis present

## 2020-03-20 DIAGNOSIS — Z79899 Other long term (current) drug therapy: Secondary | ICD-10-CM

## 2020-03-20 DIAGNOSIS — K7689 Other specified diseases of liver: Secondary | ICD-10-CM | POA: Diagnosis present

## 2020-03-20 DIAGNOSIS — I10 Essential (primary) hypertension: Secondary | ICD-10-CM | POA: Diagnosis not present

## 2020-03-20 LAB — GASTROINTESTINAL PANEL BY PCR, STOOL (REPLACES STOOL CULTURE)

## 2020-03-20 LAB — CBC WITH DIFFERENTIAL/PLATELET
Abs Immature Granulocytes: 0.08 10*3/uL — ABNORMAL HIGH (ref 0.00–0.07)
Basophils Absolute: 0 10*3/uL (ref 0.0–0.1)
Basophils Relative: 0 %
Eosinophils Absolute: 0 10*3/uL (ref 0.0–0.5)
Eosinophils Relative: 0 %
HCT: 41 % (ref 39.0–52.0)
Hemoglobin: 14.8 g/dL (ref 13.0–17.0)
Immature Granulocytes: 1 %
Lymphocytes Relative: 7 %
Lymphs Abs: 1 10*3/uL (ref 0.7–4.0)
MCH: 33.9 pg (ref 26.0–34.0)
MCHC: 36.1 g/dL — ABNORMAL HIGH (ref 30.0–36.0)
MCV: 93.8 fL (ref 80.0–100.0)
Monocytes Absolute: 1.7 10*3/uL — ABNORMAL HIGH (ref 0.1–1.0)
Monocytes Relative: 13 %
Neutro Abs: 10.7 10*3/uL — ABNORMAL HIGH (ref 1.7–7.7)
Neutrophils Relative %: 79 %
Platelets: 179 10*3/uL (ref 150–400)
RBC: 4.37 MIL/uL (ref 4.22–5.81)
RDW: 12.8 % (ref 11.5–15.5)
WBC: 13.5 10*3/uL — ABNORMAL HIGH (ref 4.0–10.5)
nRBC: 0 % (ref 0.0–0.2)

## 2020-03-20 LAB — COMPREHENSIVE METABOLIC PANEL
ALT: 25 U/L (ref 0–44)
AST: 24 U/L (ref 15–41)
Albumin: 4.2 g/dL (ref 3.5–5.0)
Alkaline Phosphatase: 60 U/L (ref 38–126)
Anion gap: 12 (ref 5–15)
BUN: 15 mg/dL (ref 6–20)
CO2: 21 mmol/L — ABNORMAL LOW (ref 22–32)
Calcium: 8.8 mg/dL — ABNORMAL LOW (ref 8.9–10.3)
Chloride: 105 mmol/L (ref 98–111)
Creatinine, Ser: 1.15 mg/dL (ref 0.61–1.24)
GFR calc Af Amer: >60 mL/min (ref >60)
GFR calc non Af Amer: >60 mL/min (ref >60)
Glucose, Bld: 114 mg/dL — ABNORMAL HIGH (ref 70–99)
Potassium: 3.7 mmol/L (ref 3.5–5.1)
Sodium: 138 mmol/L (ref 135–145)
Total Bilirubin: 1.4 mg/dL — ABNORMAL HIGH (ref 0.3–1.2)
Total Protein: 7.1 g/dL (ref 6.5–8.1)

## 2020-03-20 LAB — LIPASE, BLOOD: Lipase: 25 U/L (ref 11–51)

## 2020-03-20 LAB — URINALYSIS, COMPLETE (UACMP) WITH MICROSCOPIC
Bacteria, UA: NONE SEEN
Bilirubin Urine: NEGATIVE
Glucose, UA: NEGATIVE mg/dL
Hgb urine dipstick: NEGATIVE
Ketones, ur: 20 mg/dL — AB
Leukocytes,Ua: NEGATIVE
Nitrite: NEGATIVE
Protein, ur: 30 mg/dL — AB
Specific Gravity, Urine: 1.026 (ref 1.005–1.030)
Squamous Epithelial / HPF: NONE SEEN (ref 0–5)
pH: 5 (ref 5.0–8.0)

## 2020-03-20 LAB — SARS CORONAVIRUS 2 BY RT PCR (HOSPITAL ORDER, PERFORMED IN ~~LOC~~ HOSPITAL LAB): SARS Coronavirus 2: NEGATIVE

## 2020-03-20 LAB — C DIFFICILE QUICK SCREEN W PCR REFLEX
C Diff antigen: NEGATIVE
C Diff interpretation: NOT DETECTED
C Diff toxin: NEGATIVE

## 2020-03-20 LAB — LACTIC ACID, PLASMA: Lactic Acid, Venous: 0.9 mmol/L (ref 0.5–1.9)

## 2020-03-20 MED ORDER — PANTOPRAZOLE SODIUM 40 MG IV SOLR
40.0000 mg | INTRAVENOUS | Status: DC
  Administered 2020-03-20 – 2020-03-21 (×2): 40 mg via INTRAVENOUS
  Filled 2020-03-20 (×2): qty 40

## 2020-03-20 MED ORDER — ENOXAPARIN SODIUM 40 MG/0.4ML ~~LOC~~ SOLN
40.0000 mg | SUBCUTANEOUS | Status: DC
  Administered 2020-03-20: 40 mg via SUBCUTANEOUS
  Filled 2020-03-20 (×2): qty 0.4

## 2020-03-20 MED ORDER — SODIUM CHLORIDE 0.9 % IV SOLN: INTRAVENOUS | Status: DC

## 2020-03-20 MED ORDER — OXYCODONE-ACETAMINOPHEN 5-325 MG PO TABS
1.0000 | ORAL_TABLET | Freq: Once | ORAL | Status: AC
  Administered 2020-03-20: 1 via ORAL
  Filled 2020-03-20: qty 1

## 2020-03-20 MED ORDER — CIPROFLOXACIN IN D5W 400 MG/200ML IV SOLN
400.0000 mg | Freq: Once | INTRAVENOUS | Status: AC
  Administered 2020-03-20: 400 mg via INTRAVENOUS
  Filled 2020-03-20: qty 200

## 2020-03-20 MED ORDER — CIPROFLOXACIN IN D5W 400 MG/200ML IV SOLN
400.0000 mg | Freq: Two times a day (BID) | INTRAVENOUS | Status: DC
  Administered 2020-03-21 (×3): 400 mg via INTRAVENOUS
  Filled 2020-03-20 (×5): qty 200

## 2020-03-20 MED ORDER — ACETAMINOPHEN 325 MG PO TABS
650.0000 mg | ORAL_TABLET | Freq: Once | ORAL | Status: AC
  Administered 2020-03-20: 650 mg via ORAL
  Filled 2020-03-20: qty 2

## 2020-03-20 MED ORDER — ACETAMINOPHEN 650 MG RE SUPP: 650.0000 mg | Freq: Four times a day (QID) | RECTAL | Status: DC | PRN

## 2020-03-20 MED ORDER — ONDANSETRON HCL 4 MG/2ML IJ SOLN
4.0000 mg | Freq: Once | INTRAMUSCULAR | Status: AC
  Administered 2020-03-20: 4 mg via INTRAVENOUS
  Filled 2020-03-20: qty 2

## 2020-03-20 MED ORDER — SODIUM CHLORIDE 0.9 % IV BOLUS
1000.0000 mL | Freq: Once | INTRAVENOUS | Status: AC
  Administered 2020-03-20: 1000 mL via INTRAVENOUS

## 2020-03-20 MED ORDER — METRONIDAZOLE IN NACL 5-0.79 MG/ML-% IV SOLN
500.0000 mg | Freq: Once | INTRAVENOUS | Status: AC
  Administered 2020-03-20: 500 mg via INTRAVENOUS
  Filled 2020-03-20: qty 100

## 2020-03-20 MED ORDER — OXYCODONE-ACETAMINOPHEN 5-325 MG PO TABS
1.0000 | ORAL_TABLET | Freq: Four times a day (QID) | ORAL | Status: DC | PRN
  Administered 2020-03-20: 1 via ORAL
  Filled 2020-03-20: qty 1

## 2020-03-20 MED ORDER — ONDANSETRON HCL 4 MG/2ML IJ SOLN: 4.0000 mg | Freq: Four times a day (QID) | INTRAMUSCULAR | Status: DC | PRN

## 2020-03-20 MED ORDER — ACETAMINOPHEN 325 MG PO TABS: 650.0000 mg | ORAL_TABLET | Freq: Four times a day (QID) | ORAL | Status: DC | PRN

## 2020-03-20 MED ORDER — ONDANSETRON HCL 4 MG PO TABS: 4.0000 mg | ORAL_TABLET | Freq: Four times a day (QID) | ORAL | Status: DC | PRN

## 2020-03-20 NOTE — ED Notes (Signed)
Blood cultures sent to lab

## 2020-03-20 NOTE — ED Provider Notes (Signed)
[facility] Emergency Department Provider Note  ____________________________________________   First MD Initiated Contact with Patient 03/20/20 [phone]     (approximate)  I have reviewed the triage vital signs and the nursing notes.   HISTORY  Chief Complaint Weakness   HPI Robert Suarez is a 54 y.o. male with arthritis who comes in with continued back pain, fever, weakness.  Patient was seen yesterday for back pain and weakness. Patient was noted to be febrile to 100.5 and slightly tachycardic to 109. Chest x-ray was negative. Patient was started on Keflex for concern for pyelonephritis. Strep test was negative. Blood culture was negative x1 at 24 hours.    Patient's wife wanted make sure there was no signs of a kidney stone.  She reports just continued weakness.  He has not had any additional falls.  He denies hitting his head with the first fall.  He states that he can barely walk.  His weakness has been constant, nothing makes better, nothing makes it worse.  They also report diarrhea, multiple episodes, nonbloody in nature.  Denies any recent antibiotic use or recent hospitalization.  He does not really feel nauseous but just not eating or drinking as much due to not feeling well.  He continues to have bilateral flank pain.  Denies any dysuria but does have some increased urinary frequency.          Past Medical History:  Diagnosis Date  . Arthritis    right knee  . Family history of adverse reaction to anesthesia    son dx'd with malignant hyperthermia 3 yrs ago (after jaw surgery)  . GERD (gastroesophageal reflux disease)   . Hypertension     Patient Active Problem List   Diagnosis Date Noted  . Dysphagia   . Esophageal stricture   . Gastric polyp   . Closed fracture of proximal phalanx of left middle finger 10/16/2019  . Fracture of metacarpal bone 10/16/2019  . Sprain of metacarpophalangeal joint 10/16/2019  . HTN (hypertension) 05/29/2016      Past Surgical History:  Procedure Laterality Date  . COLONOSCOPY WITH PROPOFOL N/A 07/30/2016   Procedure: COLONOSCOPY WITH PROPOFOL;  Surgeon: Jonathon Bellows, MD;  Location: [facility] ENDOSCOPY;  Service: Endoscopy;  Laterality: N/A;  . ESOPHAGOGASTRODUODENOSCOPY (EGD) WITH PROPOFOL N/A 10/23/2019   Procedure: ESOPHAGOGASTRODUODENOSCOPY (EGD) WITH PROPOFOL;  Surgeon: Lucilla Lame, MD;  Location: [facility] ENDOSCOPY;  Service: Endoscopy;  Laterality: N/A;  . ligament repair Right   . NO PAST SURGERIES      Prior to Admission medications   Medication Sig Start Date End Date Taking? Authorizing Provider  amLODipine (NORVASC) 5 MG tablet Take 1 tablet (5 mg total) by mouth daily. 07/18/19 07/17/20  Karen Kitchens, NP  cephALEXin (KEFLEX) 500 MG capsule Take 1 capsule (500 mg total) by mouth 4 (four) times daily for 10 days. 03/19/20 03/29/20  Nena Polio, MD  lisinopril (ZESTRIL) 10 MG tablet Take 1 tablet (10 mg total) by mouth 2 (two) times daily. 07/18/19   Karen Kitchens, NP  omeprazole (PRILOSEC) 20 MG capsule Take 1 capsule (20 mg total) by mouth daily. Patient not taking: Reported on 10/16/2019 09/28/19   Karen Kitchens, NP  pantoprazole (PROTONIX) 40 MG tablet Take 1 tablet (40 mg total) by mouth daily. 10/16/19   Lucilla Lame, MD    Allergies Patient has no known allergies.  Family History  Problem Relation Age of Onset  . Healthy Mother   . Healthy Father  Social History Social History   Tobacco Use  . Smoking status: Never Smoker  . Smokeless tobacco: Never Used  Vaping Use  . Vaping Use: Never used  Substance Use Topics  . Alcohol use: No  . Drug use: No      Review of Systems Constitutional: Fevers Eyes: No visual changes. ENT: No sore throat. Cardiovascular: Denies chest pain. Respiratory: Denies shortness of breath. Gastrointestinal: No abdominal pain.   Positive diarrhea no constipation.  Bilateral flank pain, decreased p.o. intake Genitourinary: Negative for dysuria.   Increased urinary frequency Musculoskeletal: Bilateral flank pain Skin: Negative for rash. Neurological: Negative for headaches, focal weakness or numbness. All other ROS negative ____________________________________________   PHYSICAL EXAM:  VITAL SIGNS: ED Triage Vitals  Enc Vitals Group     BP 03/20/20 0201 (!) 163/91     Pulse Rate 03/20/20 0201 100     Resp 03/20/20 0201 16     Temp 03/20/20 0201 (!) 100.9 F (38.3 C)     Temp Source 03/20/20 0201 Oral     SpO2 03/20/20 0201 95 %     Weight 03/20/20 0202 199 lb 15.3 oz (90.7 kg)     Height 03/20/20 0202 _0  (1.778 m)     Head Circumference --      Peak Flow --      Pain Score 03/20/20 0202 8     Pain Loc --      Pain Edu? --      Excl. in [place]? --     Constitutional: Alert and oriented.  Patient curled up in bed. Eyes: Conjunctivae are normal. EOMI. Head: Atraumatic. Nose: No congestion/rhinnorhea. Mouth/Throat: Mucous membranes are moist.   Neck: No stridor. Trachea Midline. FROM Cardiovascular: Normal rate, regular rhythm. Grossly normal heart sounds.  Good peripheral circulation. Respiratory: Normal respiratory effort.  No retractions. Lungs CTAB. Gastrointestinal: Soft and nontender. No distention. No abdominal bruits.  Musculoskeletal: No lower extremity tenderness nor edema.  No joint effusions. Neurologic:  Normal speech and language. No gross focal neurologic deficits are appreciated.  Skin:  Skin is warm, dry and intact. No rash noted. Psychiatric: Mood and affect are normal. Speech and behavior are normal. GU: Deferred   ____________________________________________   LABS (all labs ordered are listed, but only abnormal results are displayed)  Labs Reviewed  GASTROINTESTINAL PANEL BY PCR, STOOL (REPLACES STOOL CULTURE) - Abnormal; Notable for the following components:      Result Value   Salmonella species DETECTED (*)    All other components within normal limits  CBC WITH DIFFERENTIAL/PLATELET -  Abnormal; Notable for the following components:   WBC 13.5 (*)    MCHC 36.1 (*)    Neutro Abs 10.7 (*)    Monocytes Absolute 1.7 (*)    Abs Immature Granulocytes 0.08 (*)    All other components within normal limits  COMPREHENSIVE METABOLIC PANEL - Abnormal; Notable for the following components:   CO2 21 (*)    Glucose, Bld 114 (*)    Calcium 8.8 (*)    Total Bilirubin 1.4 (*)    All other components within normal limits  URINALYSIS, COMPLETE (UACMP) WITH MICROSCOPIC - Abnormal; Notable for the following components:   Color, Urine AMBER (*)    APPearance HAZY (*)    Ketones, ur 20 (*)    Protein, ur 30 (*)    All other components within normal limits  SARS CORONAVIRUS 2 BY RT PCR (HOSPITAL ORDER, [place] [facility])  C  DIFFICILE QUICK SCREEN W PCR REFLEX  URINE CULTURE  CULTURE, BLOOD (ROUTINE X 2)  CULTURE, BLOOD (ROUTINE X 2)  LACTIC ACID, PLASMA  LIPASE, BLOOD   ____________________________________________   ED ECG REPORT I, Vanessa Taylor, the attending physician, personally viewed and interpreted this ECG.  Sinus tachycardia rate of 109, no ST elevation, T wave inversion in lead III, normal intervals ____________________________________________  RADIOLOGY   Official radiology report(s): CT Renal Stone Study  Result Date: 03/20/2020 CLINICAL DATA:  Flank pain EXAM: CT ABDOMEN AND PELVIS WITHOUT CONTRAST TECHNIQUE: Multidetector CT imaging of the abdomen and pelvis was performed following the standard protocol without IV contrast. COMPARISON:  05/29/2009 FINDINGS: Lower chest: No acute abnormality. Hepatobiliary: Diffuse fatty infiltration of the liver is noted. There is a geographic 2.6 cm hypodense lesion in the dome of the right lobe of the liver which likely represents a cyst or hemangioma. No other hepatic mass is noted. The gallbladder is within normal limits. Pancreas: Unremarkable. No pancreatic ductal dilatation or surrounding inflammatory  changes. Spleen: Normal in size without focal abnormality. Adrenals/Urinary Tract: Adrenal glands are within normal limits. Kidneys are well visualized bilaterally. No renal calculi or obstructive changes are noted. The ureters are within normal limits. Bladder is decompressed. Stomach/Bowel: The appendix is within normal limits. The colon is within normal limits with the exception of the cecum which demonstrates some mild inflammatory change. There is wall thickening within the terminal ileum with some mucosal hyperemia. These changes along with changes in the cecum may simply represent infectious enteritis. The possibility of underlying inflammatory bowel disease deserves consideration however. Further workup can be performed as clinically indicated. More proximal small bowel and stomach are within normal limits. Vascular/Lymphatic: No significant vascular findings are present. No enlarged abdominal or pelvic lymph nodes. Reproductive: Prostate is unremarkable. Other: No abdominal wall hernia or abnormality. No abdominopelvic ascites. Musculoskeletal: Bony structures show degenerative change of the lumbar spine. IMPRESSION: Changes in the terminal ileum and cecum consistent with enteritis. The etiology is uncertain but inflammatory bowel disease deserves consideration. Further workup is recommended. Fatty infiltration. 2.6 cm hypodense lesion in the dome of the right lobe of the liver. This may simply represent a cyst although hemangiomas the deserves consideration as well. This is larger than that seen on prior examination from 2010. Nonemergent ultrasound may be helpful for further evaluation. Electronically Signed   By: Inez Catalina M.D.   On: 03/20/2020 02:54    ____________________________________________   PROCEDURES  Procedure(s) performed (including Critical Care):  .Critical Care Performed by: Vanessa [place], MD Authorized by: Vanessa [place], MD   Critical care provider statement:    Critical  care time (minutes):  35   Critical care was necessary to treat or prevent imminent or life-threatening deterioration of the following conditions:  Sepsis   Critical care was time spent personally by me on the following activities:  Discussions with consultants, evaluation of patient's response to treatment, examination of patient, ordering and performing treatments and interventions, ordering and review of laboratory studies, ordering and review of radiographic studies, pulse oximetry, re-evaluation of patient's condition, obtaining history from patient or surrogate and review of old charts     ____________________________________________   INITIAL IMPRESSION / [place] / ED COURSE  Kiyan Burmester was evaluated in Emergency Department on 03/20/2020 for the symptoms described in the history of present illness. He was evaluated in the context of the global COVID-19 pandemic, which necessitated consideration that the patient might be at  risk for infection with the SARS-CoV-2 virus that causes COVID-19. Institutional protocols and algorithms that pertain to the evaluation of patients at risk for COVID-19 are in a state of rapid change based on information released by regulatory bodies including the CDC and federal and state organizations. These policies and algorithms were followed during the patient's care in the ED.    Patient is a 54 year old who comes in with weakness, bilateral flank pain, fevers, diarrhea.  Patient was diagnosed with pyelonephritis yesterday.  Given his increased urinary frequency is steadily possible that he could have UTI with pyelonephritis given the flank tenderness.  However will get CT scan to make sure there is no evidence of infected kidney stone or other acute abdominal process such as appendicitis, diverticulitis.  Will get repeat labs to evaluate for worsening electrolyte abnormalities given his decreased p.o. intake.  Covid test is negative. Lactate is  normal White count slightly elevated at 13.5 down from fifteen 1 day ago Electrolytes are reassuring UA with 11-20 WBCs will send for urine culture given was not done yesterday  CT scan concerning for enteritis to changes in the terminal ileum. Incidental findings noted and I did discuss this follow up with pt.   Discussed with family that given his diarrhea that he most likely has enteritis.  At this time given the CT scan I have lower concern for pyelonephritis.  We discussed giving some fluids and nausea medicine and reevaluating patient and see how he does with ambulating.  Will get stool study as well.  Discussed with them that if his symptoms are still severe we did discuss with the hospital team for admission but if he is able to ambulate and is feeling better we can potentially send him home.  Patient initially met sepsis criteria.  Blood cultures were drawn.  His C. difficile was negative.  Reevaluated patient he states that he still feels very weak and feels like he needs to come into the hospital for his weakness, decreased p.o. intake.  Will start on antibiotics Cipro and Flagyl while awaiting GI panel.  Patient's heart rate has come down and temperature has come down with fluids and Tylenol.  He remains well-perfused looking but given his symptoms will discuss with hospital team for admission  On discussion with the hospital team his GI panel did result as Salmonella.  Hospital team is aware.  ____________________________________________   FINAL CLINICAL IMPRESSION(S) / ED DIAGNOSES   Final diagnoses:  Sepsis, due to unspecified organism, unspecified whether acute organ dysfunction present ([place])  Salmonella  Weakness      MEDICATIONS GIVEN DURING THIS VISIT:  Medications  metroNIDAZOLE (FLAGYL) IVPB 500 mg (500 mg Intravenous New Bag/Given 03/20/20 1253)  ciprofloxacin (CIPRO) IVPB 400 mg (400 mg Intravenous New Bag/Given 03/20/20 1252)  sodium chloride 0.9 % bolus 1,000 mL  (has no administration in time range)  acetaminophen (TYLENOL) tablet 650 mg (650 mg Oral Given 03/20/20 0212)  oxyCODONE-acetaminophen (PERCOCET/ROXICET) 5-325 MG per tablet 1 tablet (1 tablet Oral Given 03/20/20 0212)  sodium chloride 0.9 % bolus 1,000 mL (0 mLs Intravenous Stopped 03/20/20 1207)  ondansetron (ZOFRAN) injection 4 mg (4 mg Intravenous Given 03/20/20 1027)     ED Discharge Orders    None       Note:  This document was prepared using Dragon voice recognition software and may include unintentional dictation errors.   Vanessa Hins[place], MD 03/20/20 1318

## 2020-03-20 NOTE — Discharge Instructions (Signed)
IMPRESSION:  Changes in the terminal ileum and cecum consistent with enteritis.  The etiology is uncertain but inflammatory bowel disease deserves  consideration. Further workup is recommended.   Fatty infiltration.   2.6 cm hypodense lesion in the dome of the right lobe of the liver.  This may simply represent a cyst although hemangiomas the deserves  consideration as well. This is larger than that seen on prior  examination from 2010. Nonemergent ultrasound may be helpful for  further evaluation.

## 2020-03-20 NOTE — ED Notes (Signed)
Crackers and beverage provided to pt.

## 2020-03-20 NOTE — ED Notes (Signed)
Pt notified to contact family to update them on his dispostition.

## 2020-03-20 NOTE — ED Notes (Addendum)
Patient requested Percocet which is not a PRN order. Night-time hospitalist was texted. Wife is at bedside. Patient is up to room commode frequently. Enteric precautions in place.

## 2020-03-20 NOTE — ED Notes (Signed)
Patient reports a decrease in frequency in BMs.

## 2020-03-20 NOTE — H&P (Addendum)
History and Physical    Robert Suarez NKN:[id number] DOB: 1966-06-03 DOA: 03/20/2020  PCP: Patient, No Pcp Per   Patient coming from: Home  I have personally briefly reviewed patient's old medical records in [place]  Chief Complaint: Weakness  HPI: Robert Suarez is a 54 y.o. male with medical history significant for GERD and arthritis who presents to the emergency room for evaluation of fever, weakness and diarrhea.  Patient states that he has had symptoms for a couple of days but got worse yesterday when he presented to the ER for evaluation of weakness, flank pain and a fall.  He was discharged home on antibiotics for possible pyelonephritis but patient returned to the ER due to worsening symptoms. He has had multiple episodes of diarrhea which is nonbloody in nature and foul-smelling associated with nausea and poor oral intake.  Patient states his symptoms started after he ate a fish sandwich from [employer].  He denies any recent antibiotic use.  He denies having any sick contacts.  He states that he had abdominal pain but that has improved.  He denies having any urinary symptoms, no cough no shortness of breath no chest pain.  He has felt dizzy and lightheaded. Vital signs in the ER showed a T-max of 100.16F, he was tachycardic and has a white cell count of 13,000.  Lactic acid was within normal limits. He had a CT scan of abdomen and pelvis which showed changes in the terminal ileum and cecum consistent with enteritis. The etiology is uncertain but inflammatory bowel disease deserves consideration. Further workup is recommended. Stool work-up is positive for Salmonella  ED Course: Patient is a 54 year old male who presents to the emergency room twice within 24 hours with complaints of generalized weakness, fall, diarrhea associated with nausea and poor oral intake.  Stool work-up is positive for Salmonella.  Patient had a fever with a T-max of 100.16F and was tachycardic as well.  Review  of Systems: As per HPI otherwise 10 point review of systems negative.    Past Medical History:  Diagnosis Date  . Arthritis    right knee  . Family history of adverse reaction to anesthesia    son dx'd with malignant hyperthermia 3 yrs ago (after jaw surgery)  . GERD (gastroesophageal reflux disease)   . Hypertension     Past Surgical History:  Procedure Laterality Date  . COLONOSCOPY WITH PROPOFOL N/A 07/30/2016   Procedure: COLONOSCOPY WITH PROPOFOL;  Surgeon: Jonathon Bellows, MD;  Location: [facility] ENDOSCOPY;  Service: Endoscopy;  Laterality: N/A;  . ESOPHAGOGASTRODUODENOSCOPY (EGD) WITH PROPOFOL N/A 10/23/2019   Procedure: ESOPHAGOGASTRODUODENOSCOPY (EGD) WITH PROPOFOL;  Surgeon: Lucilla Lame, MD;  Location: [facility] ENDOSCOPY;  Service: Endoscopy;  Laterality: N/A;  . ligament repair Right   . NO PAST SURGERIES       reports that he has never smoked. He has never used smokeless tobacco. He reports that he does not drink alcohol and does not use drugs.  No Known Allergies  Family History  Problem Relation Age of Onset  . Healthy Mother   . Healthy Father      Prior to Admission medications   Medication Sig Start Date End Date Taking? Authorizing Provider  amLODipine (NORVASC) 5 MG tablet Take 1 tablet (5 mg total) by mouth daily. 07/18/19 07/17/20  Karen Kitchens, NP  cephALEXin (KEFLEX) 500 MG capsule Take 1 capsule (500 mg total) by mouth 4 (four) times daily for 10 days. 03/19/20 03/29/20  Nena Polio, MD  lisinopril (ZESTRIL) 10 MG tablet Take 1 tablet (10 mg total) by mouth 2 (two) times daily. 07/18/19   Karen Kitchens, NP  omeprazole (PRILOSEC) 20 MG capsule Take 1 capsule (20 mg total) by mouth daily. Patient not taking: Reported on 10/16/2019 09/28/19   Karen Kitchens, NP  pantoprazole (PROTONIX) 40 MG tablet Take 1 tablet (40 mg total) by mouth daily. 10/16/19   Lucilla Lame, MD    Physical Exam: Vitals:   03/20/20 0858 03/20/20 1030 03/20/20 1034 03/20/20 1100  BP: (!) 142/81  (!) 140/93  (!) 154/104  Pulse: 81 85  81  Resp: 16     Temp: 98.9 F (37.2 C)  98.6 F (37 C)   TempSrc: Oral  Oral   SpO2: 95% 94%  95%  Weight:      Height:         Vitals:   03/20/20 0858 03/20/20 1030 03/20/20 1034 03/20/20 1100  BP: (!) 142/81 (!) 140/93  (!) 154/104  Pulse: 81 85  81  Resp: 16     Temp: 98.9 F (37.2 C)  98.6 F (37 C)   TempSrc: Oral  Oral   SpO2: 95% 94%  95%  Weight:      Height:        Constitutional: NAD, alert and oriented x 3.  Acutely ill-appearing Eyes: PERRL, lids and conjunctivae pale ENMT: Mucous membranes are dry Neck: normal, supple, no masses, no thyromegaly Respiratory: clear to auscultation bilaterally, no wheezing, no crackles. Normal respiratory effort. No accessory muscle use.  Cardiovascular: Regular rate and rhythm, no murmurs / rubs / gallops. No extremity edema. 2+ pedal pulses. No carotid bruits.  Abdomen: no tenderness, no masses palpated. No hepatosplenomegaly. Bowel sounds positive.  Musculoskeletal: no clubbing / cyanosis. No joint deformity upper and lower extremities.  Skin: no rashes, lesions, ulcers.  Neurologic: No gross focal neurologic deficit.  Generalized weakness Psychiatric: Normal mood and affect.   Labs on Admission: I have personally reviewed following labs and imaging studies  CBC: Recent Labs  Lab 03/19/20 0603 03/20/20 0214  WBC 15.1* 13.5*  NEUTROABS 12.5* 10.7*  HGB 15.3 14.8  HCT 43.3 41.0  MCV 93.5 93.8  PLT 221 476   Basic Metabolic Panel: Recent Labs  Lab 03/19/20 0603 03/20/20 0214  NA 138 138  K 4.0 3.7  CL 100 105  CO2 24 21*  GLUCOSE 119* 114*  BUN 12 15  CREATININE 1.16 1.15  CALCIUM 9.1 8.8*   GFR: Estimated Creatinine Clearance: 83.2 mL/min (by C-G formula based on SCr of 1.15 mg/dL). Liver Function Tests: Recent Labs  Lab 03/19/20 0603 03/20/20 0214  AST 22 24  ALT 23 25  ALKPHOS 68 60  BILITOT 1.2 1.4*  PROT 7.3 7.1  ALBUMIN 4.4 4.2   Recent Labs  Lab  03/19/20 0603 03/20/20 0214  LIPASE 26 25   No results for input(s): AMMONIA in the last 168 hours. Coagulation Profile: Recent Labs  Lab 03/19/20 0634  INR 1.1   Cardiac Enzymes: No results for input(s): CKTOTAL, CKMB, CKMBINDEX, TROPONINI in the last 168 hours. BNP (last 3 results) No results for input(s): PROBNP in the last 8760 hours. HbA1C: No results for input(s): HGBA1C in the last 72 hours. CBG: No results for input(s): GLUCAP in the last 168 hours. Lipid Profile: No results for input(s): CHOL, HDL, LDLCALC, TRIG, CHOLHDL, LDLDIRECT in the last 72 hours. Thyroid Function Tests: No results for input(s): TSH, T4TOTAL, FREET4, T3FREE, THYROIDAB in the  last 72 hours. Anemia Panel: No results for input(s): VITAMINB12, FOLATE, FERRITIN, TIBC, IRON, RETICCTPCT in the last 72 hours. Urine analysis:    Component Value Date/Time   COLORURINE AMBER (A) 03/20/2020 0214   APPEARANCEUR HAZY (A) 03/20/2020 0214   APPEARANCEUR Clear 12/21/2013 0143   LABSPEC 1.026 03/20/2020 0214   LABSPEC 1.009 12/21/2013 0143   PHURINE 5.0 03/20/2020 0214   GLUCOSEU NEGATIVE 03/20/2020 0214   GLUCOSEU Negative 12/21/2013 0143   HGBUR NEGATIVE 03/20/2020 0214   BILIRUBINUR NEGATIVE 03/20/2020 0214   BILIRUBINUR Negative 12/21/2013 0143   KETONESUR 20 (A) 03/20/2020 0214   PROTEINUR 30 (A) 03/20/2020 0214   NITRITE NEGATIVE 03/20/2020 0214   LEUKOCYTESUR NEGATIVE 03/20/2020 0214   LEUKOCYTESUR Negative 12/21/2013 0143    Radiological Exams on Admission: DG Chest 2 View  Result Date: 03/19/2020 CLINICAL DATA:  Suspected sepsis. EXAM: CHEST - 2 VIEW COMPARISON:  No prior. FINDINGS: Mediastinum and hilar structures normal. Low lung volumes. No focal infiltrate. No pleural effusion or pneumothorax. No pleural effusion or pneumothorax. IMPRESSION: No acute cardiopulmonary disease. Electronically Signed   By: Marcello Moores  Register   On: 03/19/2020 08:01   CT Renal Stone Study  Result Date:  03/20/2020 CLINICAL DATA:  Flank pain EXAM: CT ABDOMEN AND PELVIS WITHOUT CONTRAST TECHNIQUE: Multidetector CT imaging of the abdomen and pelvis was performed following the standard protocol without IV contrast. COMPARISON:  05/29/2009 FINDINGS: Lower chest: No acute abnormality. Hepatobiliary: Diffuse fatty infiltration of the liver is noted. There is a geographic 2.6 cm hypodense lesion in the dome of the right lobe of the liver which likely represents a cyst or hemangioma. No other hepatic mass is noted. The gallbladder is within normal limits. Pancreas: Unremarkable. No pancreatic ductal dilatation or surrounding inflammatory changes. Spleen: Normal in size without focal abnormality. Adrenals/Urinary Tract: Adrenal glands are within normal limits. Kidneys are well visualized bilaterally. No renal calculi or obstructive changes are noted. The ureters are within normal limits. Bladder is decompressed. Stomach/Bowel: The appendix is within normal limits. The colon is within normal limits with the exception of the cecum which demonstrates some mild inflammatory change. There is wall thickening within the terminal ileum with some mucosal hyperemia. These changes along with changes in the cecum may simply represent infectious enteritis. The possibility of underlying inflammatory bowel disease deserves consideration however. Further workup can be performed as clinically indicated. More proximal small bowel and stomach are within normal limits. Vascular/Lymphatic: No significant vascular findings are present. No enlarged abdominal or pelvic lymph nodes. Reproductive: Prostate is unremarkable. Other: No abdominal wall hernia or abnormality. No abdominopelvic ascites. Musculoskeletal: Bony structures show degenerative change of the lumbar spine. IMPRESSION: Changes in the terminal ileum and cecum consistent with enteritis. The etiology is uncertain but inflammatory bowel disease deserves consideration. Further workup is  recommended. Fatty infiltration. 2.6 cm hypodense lesion in the dome of the right lobe of the liver. This may simply represent a cyst although hemangiomas the deserves consideration as well. This is larger than that seen on prior examination from 2010. Nonemergent ultrasound may be helpful for further evaluation. Electronically Signed   By: Inez Catalina M.D.   On: 03/20/2020 02:54    EKG: Independently reviewed Sinus tachycardia  Assessment/Plan Principal Problem:   Salmonella gastroenteritis Active Problems:   HTN (hypertension)    Salmonella gastroenteritis Place patient on enteric precautions Aggressive IV fluid resuscitation Supportive care Check and supplement electrolytes as needed Continue ciprofloxacin 400 mg p.o. twice daily initiated in the emergency room  Hypertension Hold lisinopril due to volume depletion Continue amlodipine    DVT prophylaxis: Lovenox Code Status: Full code Family Communication: Greater than 50% of time was spent discussing plan of care with patient at the bedside.  He verbalizes understanding and agree with the plan. Disposition Plan: Back to previous home environment Consults called: None    Fraida Veldman MD [facility]     03/20/2020, 1:52 PM

## 2020-03-21 ENCOUNTER — Encounter: Payer: Self-pay | Admitting: Internal Medicine

## 2020-03-21 ENCOUNTER — Other Ambulatory Visit: Payer: Self-pay

## 2020-03-21 DIAGNOSIS — R531 Weakness: Secondary | ICD-10-CM | POA: Diagnosis not present

## 2020-03-21 DIAGNOSIS — I10 Essential (primary) hypertension: Secondary | ICD-10-CM | POA: Diagnosis not present

## 2020-03-21 DIAGNOSIS — K529 Noninfective gastroenteritis and colitis, unspecified: Secondary | ICD-10-CM | POA: Diagnosis not present

## 2020-03-21 DIAGNOSIS — Z8719 Personal history of other diseases of the digestive system: Secondary | ICD-10-CM | POA: Diagnosis not present

## 2020-03-21 DIAGNOSIS — M1990 Unspecified osteoarthritis, unspecified site: Secondary | ICD-10-CM | POA: Diagnosis not present

## 2020-03-21 DIAGNOSIS — R000 Tachycardia, unspecified: Secondary | ICD-10-CM | POA: Diagnosis not present

## 2020-03-21 DIAGNOSIS — Z20822 Contact with and (suspected) exposure to covid-19: Secondary | ICD-10-CM | POA: Diagnosis not present

## 2020-03-21 DIAGNOSIS — A419 Sepsis, unspecified organism: Secondary | ICD-10-CM | POA: Diagnosis not present

## 2020-03-21 DIAGNOSIS — K7689 Other specified diseases of liver: Secondary | ICD-10-CM | POA: Diagnosis not present

## 2020-03-21 DIAGNOSIS — A020 Salmonella enteritis: Secondary | ICD-10-CM | POA: Diagnosis not present

## 2020-03-21 DIAGNOSIS — K219 Gastro-esophageal reflux disease without esophagitis: Secondary | ICD-10-CM | POA: Diagnosis not present

## 2020-03-21 DIAGNOSIS — Z9181 History of falling: Secondary | ICD-10-CM | POA: Diagnosis not present

## 2020-03-21 DIAGNOSIS — A029 Salmonella infection, unspecified: Secondary | ICD-10-CM | POA: Diagnosis not present

## 2020-03-21 DIAGNOSIS — N12 Tubulo-interstitial nephritis, not specified as acute or chronic: Secondary | ICD-10-CM | POA: Diagnosis not present

## 2020-03-21 DIAGNOSIS — Z79899 Other long term (current) drug therapy: Secondary | ICD-10-CM | POA: Diagnosis not present

## 2020-03-21 LAB — BASIC METABOLIC PANEL
Anion gap: 7 (ref 5–15)
BUN: 11 mg/dL (ref 6–20)
CO2: 26 mmol/L (ref 22–32)
Calcium: 8.6 mg/dL — ABNORMAL LOW (ref 8.9–10.3)
Chloride: 108 mmol/L (ref 98–111)
Creatinine, Ser: 1.2 mg/dL (ref 0.61–1.24)
GFR calc Af Amer: >60 mL/min (ref >60)
GFR calc non Af Amer: >60 mL/min (ref >60)
Glucose, Bld: 93 mg/dL (ref 70–99)
Potassium: 3.8 mmol/L (ref 3.5–5.1)
Sodium: 141 mmol/L (ref 135–145)

## 2020-03-21 LAB — CBC
HCT: 38.7 % — ABNORMAL LOW (ref 39.0–52.0)
Hemoglobin: 13.6 g/dL (ref 13.0–17.0)
MCH: 33.3 pg (ref 26.0–34.0)
MCHC: 35.1 g/dL (ref 30.0–36.0)
MCV: 94.9 fL (ref 80.0–100.0)
Platelets: 155 10*3/uL (ref 150–400)
RBC: 4.08 MIL/uL — ABNORMAL LOW (ref 4.22–5.81)
RDW: 12.9 % (ref 11.5–15.5)
WBC: 8 10*3/uL (ref 4.0–10.5)
nRBC: 0 % (ref 0.0–0.2)

## 2020-03-21 LAB — URINE CULTURE: Culture: NO GROWTH

## 2020-03-21 LAB — HIV ANTIBODY (ROUTINE TESTING W REFLEX): HIV Screen 4th Generation wRfx: NONREACTIVE

## 2020-03-21 MED ORDER — IBUPROFEN 400 MG PO TABS
400.0000 mg | ORAL_TABLET | Freq: Once | ORAL | Status: AC
  Administered 2020-03-21: 400 mg via ORAL
  Filled 2020-03-21: qty 1

## 2020-03-21 MED ORDER — LISINOPRIL 10 MG PO TABS
10.0000 mg | ORAL_TABLET | Freq: Two times a day (BID) | ORAL | Status: DC
  Administered 2020-03-21 – 2020-03-22 (×3): 10 mg via ORAL
  Filled 2020-03-21 (×3): qty 1

## 2020-03-21 MED ORDER — ACETAMINOPHEN 500 MG PO TABS
1000.0000 mg | ORAL_TABLET | Freq: Once | ORAL | Status: AC
  Administered 2020-03-21: 1000 mg via ORAL
  Filled 2020-03-21: qty 2

## 2020-03-21 MED ORDER — AMLODIPINE BESYLATE 5 MG PO TABS
5.0000 mg | ORAL_TABLET | Freq: Every day | ORAL | Status: DC
  Administered 2020-03-21 – 2020-03-22 (×2): 5 mg via ORAL
  Filled 2020-03-21 (×2): qty 1

## 2020-03-21 NOTE — Progress Notes (Signed)
PROGRESS NOTE    Robert Suarez  OZY:[id number] DOB: Oct 20, 1965 DOA: 03/20/2020 PCP: Patient, No Pcp Per    Assessment & Plan:   Principal Problem:   Salmonella gastroenteritis Active Problems:   HTN (hypertension)    Robert Suarez is a 54 y.o. male with medical history significant for GERD and arthritis who presents to the emergency room for evaluation of fever, weakness and diarrhea.  Patient states that he has had symptoms for a couple of days but got worse yesterday when he presented to the ER for evaluation of weakness, flank pain and a fall.  He was discharged home on antibiotics for possible pyelonephritis but patient returned to the ER due to worsening symptoms.   Diarrhea 2/2 Salmonella gastroenteritis --started on Cipro on presentation --diarrhea improved  PLAN: Continue IV fluid resuscitation Continue ciprofloxacin 400 mg p.o. twice daily  Advance diet to regular, per pt request   Hypertension Continue lisinopril Continue amlodipine   DVT prophylaxis: Lovenox SQ Code Status: Full code  Family Communication: wife updated at bedside today Status is: inpatient Dispo:   The patient is from: home Anticipated d/c is to: home Anticipated d/c date is: tomorrow Patient currently is not medically stable to d/c due to: still symptomatic with abdominal pain and diarrhea, on IVF, and need to ensure tolerating diet   Subjective and Interval History:  Complained of back pain and abdominal pain.  Complained of headache.  Diarrhea improved.     Objective: Vitals:   03/20/20 2347 03/21/20 0629 03/21/20 1401 03/21/20 1805  BP: (!) 152/93 (!) 169/98 (!) 154/112 (!) 152/93  Pulse: 77 84 82 79  Resp:  20 14   Temp: 98.6 F (37 C) 98.6 F (37 C) 99.1 F (37.3 C)   TempSrc: Oral Oral Oral   SpO2: 94% 95% 97% 98%  Weight:      Height:        Intake/Output Summary (Last 24 hours) at 03/21/2020 1957 Last data filed at 03/21/2020 1736 Gross per 24 hour  Intake 3251.84 ml    Output --  Net 3251.84 ml   Filed Weights   03/20/20 0202  Weight: 90.7 kg    Examination:   Constitutional: NAD, AAOx3 HEENT: conjunctivae and lids normal, EOMI CV: RRR no M,R,G. Distal pulses +2.  No cyanosis.   RESP: CTA B/L, normal respiratory effort  GI: +BS, NTND Extremities: No effusions, edema, or tenderness in BLE SKIN: warm, dry and intact Neuro: II - XII grossly intact.  Sensation intact Psych: Flat mood and affect.     Data Reviewed: I have personally reviewed following labs and imaging studies  CBC: Recent Labs  Lab 03/19/20 0603 03/20/20 0214 03/21/20 0505  WBC 15.1* 13.5* 8.0  NEUTROABS 12.5* 10.7*  --   HGB 15.3 14.8 13.6  HCT 43.3 41.0 38.7*  MCV 93.5 93.8 94.9  PLT 221 179 048   Basic Metabolic Panel: Recent Labs  Lab 03/19/20 0603 03/20/20 0214 03/21/20 0505  NA 138 138 141  K 4.0 3.7 3.8  CL 100 105 108  CO2 24 21* 26  GLUCOSE 119* 114* 93  BUN 12 15 11   CREATININE 1.16 1.15 1.20  CALCIUM 9.1 8.8* 8.6*   GFR: Estimated Creatinine Clearance: 79.7 mL/min (by C-G formula based on SCr of 1.2 mg/dL). Liver Function Tests: Recent Labs  Lab 03/19/20 0603 03/20/20 0214  AST 22 24  ALT 23 25  ALKPHOS 68 60  BILITOT 1.2 1.4*  PROT 7.3 7.1  ALBUMIN 4.4 4.2  Recent Labs  Lab 03/19/20 0603 03/20/20 0214  LIPASE 26 25   No results for input(s): AMMONIA in the last 168 hours. Coagulation Profile: Recent Labs  Lab 03/19/20 0634  INR 1.1   Cardiac Enzymes: No results for input(s): CKTOTAL, CKMB, CKMBINDEX, TROPONINI in the last 168 hours. BNP (last 3 results) No results for input(s): PROBNP in the last 8760 hours. HbA1C: No results for input(s): HGBA1C in the last 72 hours. CBG: No results for input(s): GLUCAP in the last 168 hours. Lipid Profile: No results for input(s): CHOL, HDL, LDLCALC, TRIG, CHOLHDL, LDLDIRECT in the last 72 hours. Thyroid Function Tests: No results for input(s): TSH, T4TOTAL, FREET4, T3FREE,  THYROIDAB in the last 72 hours. Anemia Panel: No results for input(s): VITAMINB12, FOLATE, FERRITIN, TIBC, IRON, RETICCTPCT in the last 72 hours. Sepsis Labs: Recent Labs  Lab 03/19/20 0263 03/20/20 0214  LATICACIDVEN 1.6 0.9    Recent Results (from the past 240 hour(s))  Culture, blood (Routine x 2)     Status: None (Preliminary result)   Collection Time: 03/19/20  6:34 AM   Specimen: BLOOD  Result Value Ref Range Status   Specimen Description BLOOD RAC  Final   Special Requests   Final    BOTTLES DRAWN AEROBIC AND ANAEROBIC Blood Culture results may not be optimal due to an excessive volume of blood received in culture bottles   Culture   Final    NO GROWTH 2 DAYS Performed at [facility], [address] [place], [place] [address]    Report Status PENDING  Incomplete  Group A Strep by PCR ([place] Only)     Status: None   Collection Time: 03/19/20 12:30 PM   Specimen: Throat; Sterile Swab  Result Value Ref Range Status   Group A Strep by PCR NOT DETECTED NOT DETECTED Final    Comment: Performed at [facility], [address] [place], [place] [address]  SARS Coronavirus 2 by RT PCR (hospital order, performed in [place] hospital lab) Nasopharyngeal Nasopharyngeal Swab     Status: None   Collection Time: 03/20/20  2:14 AM   Specimen: Nasopharyngeal Swab  Result Value Ref Range Status   SARS Coronavirus 2 NEGATIVE NEGATIVE Final    Comment: (NOTE) SARS-CoV-2 target nucleic acids are NOT DETECTED.  The SARS-CoV-2 RNA is generally detectable in upper and lower respiratory specimens during the acute phase of infection. The lowest concentration of SARS-CoV-2 viral copies this assay can detect is 250 copies / mL. A negative result does not preclude SARS-CoV-2 infection and should not be used as the sole basis for treatment or other patient management decisions.  A negative result may occur with improper specimen collection / handling, submission of  specimen other than nasopharyngeal swab, presence of viral mutation(s) within the areas targeted by this assay, and inadequate number of viral copies (<250 copies / mL). A negative result must be combined with clinical observations, patient history, and epidemiological information.  Fact Sheet for Patients:   StrictlyIdeas.no  Fact Sheet for Healthcare Providers: BankingDealers.co.za  This test is not yet approved or  cleared by the [place] FDA and has been authorized for detection and/or diagnosis of SARS-CoV-2 by FDA under an Emergency Use Authorization (EUA).  This EUA will remain in effect (meaning this test can be used) for the duration of the COVID-19 declaration under Section 564(b)(1) of the Act, 21 U.S.C. section 360bbb-3(b)(1), unless the authorization is terminated or revoked sooner.  Performed at [facility],  [place], [place] [address]   Urine culture     Status: None   Collection Time: 03/20/20  2:14 AM   Specimen: Urine, Random  Result Value Ref Range Status   Specimen Description   Final    URINE, RANDOM Performed at [facility], [address], [place], [place] [address]    Special Requests   Final    NONE Performed at [facility], [address], [place], [place] [address]    Culture   Final    NO GROWTH Performed at [facility], [place] [address] [place], [place] [address]    Report Status 03/21/2020 FINAL  Final  Gastrointestinal Panel by PCR , Stool     Status: Abnormal   Collection Time: 03/20/20 10:16 AM   Specimen: Stool  Result Value Ref Range Status   Campylobacter species NOT DETECTED NOT DETECTED Final   Plesimonas shigelloides NOT DETECTED NOT DETECTED Final   Salmonella species DETECTED (A) NOT DETECTED Final    Comment: CRITICAL RESULT CALLED TO, READ BACK BY AND VERIFIED WITH: DEE MCLAMB,RN 1254 03/20/2020 DB    Yersinia  enterocolitica NOT DETECTED NOT DETECTED Final   Vibrio species NOT DETECTED NOT DETECTED Final   Vibrio cholerae NOT DETECTED NOT DETECTED Final   Enteroaggregative E coli (EAEC) NOT DETECTED NOT DETECTED Final   Enteropathogenic E coli (EPEC) NOT DETECTED NOT DETECTED Final   Enterotoxigenic E coli (ETEC) NOT DETECTED NOT DETECTED Final   Shiga like toxin producing E coli (STEC) NOT DETECTED NOT DETECTED Final   Shigella/Enteroinvasive E coli (EIEC) NOT DETECTED NOT DETECTED Final   Cryptosporidium NOT DETECTED NOT DETECTED Final   Cyclospora cayetanensis NOT DETECTED NOT DETECTED Final   Entamoeba histolytica NOT DETECTED NOT DETECTED Final   Giardia lamblia NOT DETECTED NOT DETECTED Final   Adenovirus F40/41 NOT DETECTED NOT DETECTED Final   Astrovirus NOT DETECTED NOT DETECTED Final   Norovirus GI/GII NOT DETECTED NOT DETECTED Final   Rotavirus A NOT DETECTED NOT DETECTED Final   Sapovirus (I, II, IV, and V) NOT DETECTED NOT DETECTED Final    Comment: Performed at [facility], [address] [place], [place] [address]  C Difficile Quick Screen w PCR reflex     Status: None   Collection Time: 03/20/20 10:16 AM   Specimen: Stool  Result Value Ref Range Status   C Diff antigen NEGATIVE NEGATIVE Final   C Diff toxin NEGATIVE NEGATIVE Final   C Diff interpretation No C. difficile detected.  Final    Comment: Performed at [facility], [address] [place], [place] [address]  Blood culture (routine x 2)     Status: None (Preliminary result)   Collection Time: 03/20/20 10:32 AM   Specimen: BLOOD  Result Value Ref Range Status   Specimen Description BLOOD RIGHT ANTECUBITAL  Final   Special Requests   Final    BOTTLES DRAWN AEROBIC AND ANAEROBIC Blood Culture adequate volume   Culture   Final    NO GROWTH < 24 HOURS Performed at [facility], [address] [place],  [address]    Report Status PENDING  Incomplete  Blood culture (routine x  2)     Status: None (Preliminary result)   Collection Time: 03/20/20 10:32 AM   Specimen: BLOOD  Result Value Ref Range Status   Specimen Description BLOOD LEFT ANTECUBITAL  Final   Special Requests   Final    BOTTLES DRAWN  AEROBIC AND ANAEROBIC Blood Culture results may not be optimal due to an excessive volume of blood received in culture bottles   Culture   Final    NO GROWTH < 24 HOURS Performed at [facility], [address] [place], [place] [address]    Report Status PENDING  Incomplete      Radiology Studies: CT Renal Stone Study  Result Date: 03/20/2020 CLINICAL DATA:  Flank pain EXAM: CT ABDOMEN AND PELVIS WITHOUT CONTRAST TECHNIQUE: Multidetector CT imaging of the abdomen and pelvis was performed following the standard protocol without IV contrast. COMPARISON:  05/29/2009 FINDINGS: Lower chest: No acute abnormality. Hepatobiliary: Diffuse fatty infiltration of the liver is noted. There is a geographic 2.6 cm hypodense lesion in the dome of the right lobe of the liver which likely represents a cyst or hemangioma. No other hepatic mass is noted. The gallbladder is within normal limits. Pancreas: Unremarkable. No pancreatic ductal dilatation or surrounding inflammatory changes. Spleen: Normal in size without focal abnormality. Adrenals/Urinary Tract: Adrenal glands are within normal limits. Kidneys are well visualized bilaterally. No renal calculi or obstructive changes are noted. The ureters are within normal limits. Bladder is decompressed. Stomach/Bowel: The appendix is within normal limits. The colon is within normal limits with the exception of the cecum which demonstrates some mild inflammatory change. There is wall thickening within the terminal ileum with some mucosal hyperemia. These changes along with changes in the cecum may simply represent infectious enteritis. The possibility of underlying inflammatory bowel disease deserves consideration however. Further workup can  be performed as clinically indicated. More proximal small bowel and stomach are within normal limits. Vascular/Lymphatic: No significant vascular findings are present. No enlarged abdominal or pelvic lymph nodes. Reproductive: Prostate is unremarkable. Other: No abdominal wall hernia or abnormality. No abdominopelvic ascites. Musculoskeletal: Bony structures show degenerative change of the lumbar spine. IMPRESSION: Changes in the terminal ileum and cecum consistent with enteritis. The etiology is uncertain but inflammatory bowel disease deserves consideration. Further workup is recommended. Fatty infiltration. 2.6 cm hypodense lesion in the dome of the right lobe of the liver. This may simply represent a cyst although hemangiomas the deserves consideration as well. This is larger than that seen on prior examination from 2010. Nonemergent ultrasound may be helpful for further evaluation. Electronically Signed   By: Inez Catalina M.D.   On: 03/20/2020 02:54     Scheduled Meds: . amLODipine  5 mg Oral Daily  . enoxaparin (LOVENOX) injection  40 mg Subcutaneous Q24H  . lisinopril  10 mg Oral BID  . pantoprazole (PROTONIX) IV  40 mg Intravenous Q24H   Continuous Infusions: . sodium chloride 125 mL/hr at 03/21/20 1942  . ciprofloxacin Stopped (03/21/20 1446)     LOS: 0 days     Enzo Bi, MD [facility] If 7PM-7AM, please contact night-coverage 03/21/2020, 7:57 PM

## 2020-03-22 LAB — BASIC METABOLIC PANEL
Anion gap: 8 (ref 5–15)
BUN: 8 mg/dL (ref 6–20)
CO2: 27 mmol/L (ref 22–32)
Calcium: 8.9 mg/dL (ref 8.9–10.3)
Chloride: 106 mmol/L (ref 98–111)
Creatinine, Ser: 0.89 mg/dL (ref 0.61–1.24)
GFR calc Af Amer: >60 mL/min (ref >60)
GFR calc non Af Amer: >60 mL/min (ref >60)
Glucose, Bld: 109 mg/dL — ABNORMAL HIGH (ref 70–99)
Potassium: 3.6 mmol/L (ref 3.5–5.1)
Sodium: 141 mmol/L (ref 135–145)

## 2020-03-22 LAB — CBC
HCT: 38.9 % — ABNORMAL LOW (ref 39.0–52.0)
Hemoglobin: 13.4 g/dL (ref 13.0–17.0)
MCH: 32.7 pg (ref 26.0–34.0)
MCHC: 34.4 g/dL (ref 30.0–36.0)
MCV: 94.9 fL (ref 80.0–100.0)
Platelets: 191 10*3/uL (ref 150–400)
RBC: 4.1 MIL/uL — ABNORMAL LOW (ref 4.22–5.81)
RDW: 12.9 % (ref 11.5–15.5)
WBC: 6.8 10*3/uL (ref 4.0–10.5)
nRBC: 0 % (ref 0.0–0.2)

## 2020-03-22 LAB — MAGNESIUM: Magnesium: 2 mg/dL (ref 1.7–2.4)

## 2020-03-22 MED ORDER — CIPROFLOXACIN HCL 500 MG PO TABS
500.0000 mg | ORAL_TABLET | Freq: Two times a day (BID) | ORAL | 0 refills | Status: AC
Start: 2020-03-22 — End: 2020-03-29

## 2020-03-22 NOTE — Progress Notes (Signed)
Robert Suarez  A and O x 4 VSS. Pt tolerating diet well. No complaints of pain or nausea. IV removed intact, prescriptions given. Pt voices understanding of discharge instructions with no further questions. Pt discharged via wheelchair with axillary.   Allergies as of 03/22/2020   No Known Allergies     Medication List    STOP taking these medications   cephALEXin 500 MG capsule Commonly known as: KEFLEX   omeprazole 20 MG capsule Commonly known as: PRILOSEC     TAKE these medications   amLODipine 5 MG tablet Commonly known as: NORVASC Take 1 tablet (5 mg total) by mouth daily.   ciprofloxacin 500 MG tablet Commonly known as: Cipro Take 1 tablet (500 mg total) by mouth 2 (two) times daily for 7 days. Antibiotic.   lisinopril 10 MG tablet Commonly known as: ZESTRIL Take 1 tablet (10 mg total) by mouth 2 (two) times daily.   pantoprazole 40 MG tablet Commonly known as: PROTONIX Take 1 tablet (40 mg total) by mouth daily.       Vitals:   03/21/20 2043 03/22/20 0559  BP: (!) 148/99 (!) 146/84  Pulse: 75 75  Resp: 18 20  Temp: 98.5 F (36.9 C) 98.3 F (36.8 C)  SpO2: 96% 96%    Robert Suarez

## 2020-03-22 NOTE — Discharge Summary (Addendum)
Physician Discharge Summary   Robert Suarez  male DOB: 05-14-1966  BJY:[id number]  PCP: Patient, No Pcp Per  Admit date: 03/20/2020 Discharge date: 03/22/2020  Admitted From: home Disposition:  home CODE STATUS: Full code  Discharge Instructions    Diet - low sodium heart healthy   Complete by: As directed    Discharge instructions   Complete by: As directed    You have received 3 days of IV antibiotic for your salmonella gastroenteritis.  Please finish 7 more days of oral antibiotic with Cipro as directed.   Dr. Enzo Bi - -   Increase activity slowly   Complete by: As directed        Hospital Course:  For full details, please see H&P, progress notes, consult notes and ancillary notes.  Briefly,  Robert Suarez a 54 y.o.AA malewith medical history significant forGERD and arthritis who presented to the emergency room for evaluation of fever, weakness and diarrhea.  Pt was recently discharged home on antibiotics for possible pyelonephritis but patient returned to the ER due to worsening symptoms.   Diarrhea 2/2 Salmonella gastroenteritis Sepsis ruled out CT renal stone study showed "Changes in the terminal ileum and cecum consistent with enteritis.   Kidneys were unremarkable.  Pt was started on IV Cipro on presentation with improvement in diarrhea.  Pt received MIVF hydration.  Pt's diet was advanced to regular prior to discharge.  Pt was discharged on 7 more days of oral Cipro.  Hypertension Continued lisinopril and amlodipine  Liver hypodense lesion in liver Likely "represent a cyst although hemangiomas the deserves consideration as well. This is larger than that seen on prior examination from 2010."  Nonemergent ultrasound recommended as outpatient.   Discharge Diagnoses:  Principal Problem:   Salmonella gastroenteritis Active Problems:   HTN (hypertension)    Discharge Instructions:  Allergies as of 03/22/2020   No Known Allergies     Medication  List    STOP taking these medications   cephALEXin 500 MG capsule Commonly known as: KEFLEX   omeprazole 20 MG capsule Commonly known as: PRILOSEC     TAKE these medications   amLODipine 5 MG tablet Commonly known as: NORVASC Take 1 tablet (5 mg total) by mouth daily.   ciprofloxacin 500 MG tablet Commonly known as: Cipro Take 1 tablet (500 mg total) by mouth 2 (two) times daily for 7 days. Antibiotic.   lisinopril 10 MG tablet Commonly known as: ZESTRIL Take 1 tablet (10 mg total) by mouth 2 (two) times daily.   pantoprazole 40 MG tablet Commonly known as: PROTONIX Take 1 tablet (40 mg total) by mouth daily.         No Known Allergies   The results of significant diagnostics from this hospitalization (including imaging, microbiology, ancillary and laboratory) are listed below for reference.   Consultations:   Procedures/Studies: DG Chest 2 View  Result Date: 03/19/2020 CLINICAL DATA:  Suspected sepsis. EXAM: CHEST - 2 VIEW COMPARISON:  No prior. FINDINGS: Mediastinum and hilar structures normal. Low lung volumes. No focal infiltrate. No pleural effusion or pneumothorax. No pleural effusion or pneumothorax. IMPRESSION: No acute cardiopulmonary disease. Electronically Signed   By: Marcello Moores  Register   On: 03/19/2020 08:01   CT Renal Stone Study  Result Date: 03/20/2020 CLINICAL DATA:  Flank pain EXAM: CT ABDOMEN AND PELVIS WITHOUT CONTRAST TECHNIQUE: Multidetector CT imaging of the abdomen and pelvis was performed following the standard protocol without IV contrast. COMPARISON:  05/29/2009 FINDINGS: Lower chest: No acute  abnormality. Hepatobiliary: Diffuse fatty infiltration of the liver is noted. There is a geographic 2.6 cm hypodense lesion in the dome of the right lobe of the liver which likely represents a cyst or hemangioma. No other hepatic mass is noted. The gallbladder is within normal limits. Pancreas: Unremarkable. No pancreatic ductal dilatation or surrounding  inflammatory changes. Spleen: Normal in size without focal abnormality. Adrenals/Urinary Tract: Adrenal glands are within normal limits. Kidneys are well visualized bilaterally. No renal calculi or obstructive changes are noted. The ureters are within normal limits. Bladder is decompressed. Stomach/Bowel: The appendix is within normal limits. The colon is within normal limits with the exception of the cecum which demonstrates some mild inflammatory change. There is wall thickening within the terminal ileum with some mucosal hyperemia. These changes along with changes in the cecum may simply represent infectious enteritis. The possibility of underlying inflammatory bowel disease deserves consideration however. Further workup can be performed as clinically indicated. More proximal small bowel and stomach are within normal limits. Vascular/Lymphatic: No significant vascular findings are present. No enlarged abdominal or pelvic lymph nodes. Reproductive: Prostate is unremarkable. Other: No abdominal wall hernia or abnormality. No abdominopelvic ascites. Musculoskeletal: Bony structures show degenerative change of the lumbar spine. IMPRESSION: Changes in the terminal ileum and cecum consistent with enteritis. The etiology is uncertain but inflammatory bowel disease deserves consideration. Further workup is recommended. Fatty infiltration. 2.6 cm hypodense lesion in the dome of the right lobe of the liver. This may simply represent a cyst although hemangiomas the deserves consideration as well. This is larger than that seen on prior examination from 2010. Nonemergent ultrasound may be helpful for further evaluation. Electronically Signed   By: Inez Catalina M.D.   On: 03/20/2020 02:54      Labs: BNP (last 3 results) No results for input(s): BNP in the last 8760 hours. Basic Metabolic Panel: Recent Labs  Lab 03/19/20 0603 03/20/20 0214 03/21/20 0505 03/22/20 0701  NA 138 138 141 141  K 4.0 3.7 3.8 3.6  CL  100 105 108 106  CO2 24 21* 26 27  GLUCOSE 119* 114* 93 109*  BUN 12 15 11 8   CREATININE 1.16 1.15 1.20 0.89  CALCIUM 9.1 8.8* 8.6* 8.9  MG  --   --   --  2.0   Liver Function Tests: Recent Labs  Lab 03/19/20 0603 03/20/20 0214  AST 22 24  ALT 23 25  ALKPHOS 68 60  BILITOT 1.2 1.4*  PROT 7.3 7.1  ALBUMIN 4.4 4.2   Recent Labs  Lab 03/19/20 0603 03/20/20 0214  LIPASE 26 25   No results for input(s): AMMONIA in the last 168 hours. CBC: Recent Labs  Lab 03/19/20 0603 03/20/20 0214 03/21/20 0505 03/22/20 0701  WBC 15.1* 13.5* 8.0 6.8  NEUTROABS 12.5* 10.7*  --   --   HGB 15.3 14.8 13.6 13.4  HCT 43.3 41.0 38.7* 38.9*  MCV 93.5 93.8 94.9 94.9  PLT 221 179 155 191   Cardiac Enzymes: No results for input(s): CKTOTAL, CKMB, CKMBINDEX, TROPONINI in the last 168 hours. BNP: Invalid input(s): POCBNP CBG: No results for input(s): GLUCAP in the last 168 hours. D-Dimer No results for input(s): DDIMER in the last 72 hours. Hgb A1c No results for input(s): HGBA1C in the last 72 hours. Lipid Profile No results for input(s): CHOL, HDL, LDLCALC, TRIG, CHOLHDL, LDLDIRECT in the last 72 hours. Thyroid function studies No results for input(s): TSH, T4TOTAL, T3FREE, THYROIDAB in the last 72 hours.  Invalid  input(s): FREET3 Anemia work up No results for input(s): VITAMINB12, FOLATE, FERRITIN, TIBC, IRON, RETICCTPCT in the last 72 hours. Urinalysis    Component Value Date/Time   COLORURINE AMBER (A) 03/20/2020 0214   APPEARANCEUR HAZY (A) 03/20/2020 0214   APPEARANCEUR Clear 12/21/2013 0143   LABSPEC 1.026 03/20/2020 0214   LABSPEC 1.009 12/21/2013 0143   PHURINE 5.0 03/20/2020 0214   GLUCOSEU NEGATIVE 03/20/2020 0214   GLUCOSEU Negative 12/21/2013 0143   HGBUR NEGATIVE 03/20/2020 0214   BILIRUBINUR NEGATIVE 03/20/2020 0214   BILIRUBINUR Negative 12/21/2013 0143   KETONESUR 20 (A) 03/20/2020 0214   PROTEINUR 30 (A) 03/20/2020 0214   NITRITE NEGATIVE 03/20/2020 0214     LEUKOCYTESUR NEGATIVE 03/20/2020 0214   LEUKOCYTESUR Negative 12/21/2013 0143   Sepsis Labs Invalid input(s): PROCALCITONIN,  WBC,  LACTICIDVEN Microbiology Recent Results (from the past 240 hour(s))  Culture, blood (Routine x 2)     Status: None (Preliminary result)   Collection Time: 03/19/20  6:34 AM   Specimen: BLOOD  Result Value Ref Range Status   Specimen Description BLOOD RAC  Final   Special Requests   Final    BOTTLES DRAWN AEROBIC AND ANAEROBIC Blood Culture results may not be optimal due to an excessive volume of blood received in culture bottles   Culture   Final    NO GROWTH 3 DAYS Performed at [facility], [address] [place], [place] [address]    Report Status PENDING  Incomplete  Group A Strep by PCR ([place] Only)     Status: None   Collection Time: 03/19/20 12:30 PM   Specimen: Throat; Sterile Swab  Result Value Ref Range Status   Group A Strep by PCR NOT DETECTED NOT DETECTED Final    Comment: Performed at [facility], [address] [place], [place] [address]  SARS Coronavirus 2 by RT PCR (hospital order, performed in [place] hospital lab) Nasopharyngeal Nasopharyngeal Swab     Status: None   Collection Time: 03/20/20  2:14 AM   Specimen: Nasopharyngeal Swab  Result Value Ref Range Status   SARS Coronavirus 2 NEGATIVE NEGATIVE Final    Comment: (NOTE) SARS-CoV-2 target nucleic acids are NOT DETECTED.  The SARS-CoV-2 RNA is generally detectable in upper and lower respiratory specimens during the acute phase of infection. The lowest concentration of SARS-CoV-2 viral copies this assay can detect is 250 copies / mL. A negative result does not preclude SARS-CoV-2 infection and should not be used as the sole basis for treatment or other patient management decisions.  A negative result may occur with improper specimen collection / handling, submission of specimen other than nasopharyngeal swab, presence of viral mutation(s) within  the areas targeted by this assay, and inadequate number of viral copies (<250 copies / mL). A negative result must be combined with clinical observations, patient history, and epidemiological information.  Fact Sheet for Patients:   StrictlyIdeas.no  Fact Sheet for Healthcare Providers: BankingDealers.co.za  This test is not yet approved or  cleared by the [place] FDA and has been authorized for detection and/or diagnosis of SARS-CoV-2 by FDA under an Emergency Use Authorization (EUA).  This EUA will remain in effect (meaning this test can be used) for the duration of the COVID-19 declaration under Section 564(b)(1) of the Act, 21 U.S.C. section 360bbb-3(b)(1), unless the authorization is terminated or revoked sooner.  Performed at [facility], [address], [place], [place] [address]   Urine culture     Status:  None   Collection Time: 03/20/20  2:14 AM   Specimen: Urine, Random  Result Value Ref Range Status   Specimen Description   Final    URINE, RANDOM Performed at [facility], [address] [place], [place] [address]    Special Requests   Final    NONE Performed at [facility], [address], [place], [place] [address]    Culture   Final    NO GROWTH Performed at [facility], [place] [address], [place], [place] [address]    Report Status 03/21/2020 FINAL  Final  Gastrointestinal Panel by PCR , Stool     Status: Abnormal   Collection Time: 03/20/20 10:16 AM   Specimen: Stool  Result Value Ref Range Status   Campylobacter species NOT DETECTED NOT DETECTED Final   Plesimonas shigelloides NOT DETECTED NOT DETECTED Final   Salmonella species DETECTED (A) NOT DETECTED Final    Comment: CRITICAL RESULT CALLED TO, READ BACK BY AND VERIFIED WITH: DEE MCLAMB,RN 1254 03/20/2020 DB    Yersinia enterocolitica NOT DETECTED NOT DETECTED Final   Vibrio species NOT DETECTED NOT DETECTED  Final   Vibrio cholerae NOT DETECTED NOT DETECTED Final   Enteroaggregative E coli (EAEC) NOT DETECTED NOT DETECTED Final   Enteropathogenic E coli (EPEC) NOT DETECTED NOT DETECTED Final   Enterotoxigenic E coli (ETEC) NOT DETECTED NOT DETECTED Final   Shiga like toxin producing E coli (STEC) NOT DETECTED NOT DETECTED Final   Shigella/Enteroinvasive E coli (EIEC) NOT DETECTED NOT DETECTED Final   Cryptosporidium NOT DETECTED NOT DETECTED Final   Cyclospora cayetanensis NOT DETECTED NOT DETECTED Final   Entamoeba histolytica NOT DETECTED NOT DETECTED Final   Giardia lamblia NOT DETECTED NOT DETECTED Final   Adenovirus F40/41 NOT DETECTED NOT DETECTED Final   Astrovirus NOT DETECTED NOT DETECTED Final   Norovirus GI/GII NOT DETECTED NOT DETECTED Final   Rotavirus A NOT DETECTED NOT DETECTED Final   Sapovirus (I, II, IV, and V) NOT DETECTED NOT DETECTED Final    Comment: Performed at [facility], [address] [place], [place] [address]  C Difficile Quick Screen w PCR reflex     Status: None   Collection Time: 03/20/20 10:16 AM   Specimen: Stool  Result Value Ref Range Status   C Diff antigen NEGATIVE NEGATIVE Final   C Diff toxin NEGATIVE NEGATIVE Final   C Diff interpretation No C. difficile detected.  Final    Comment: Performed at [facility], [address] [place], [place] [address]  Blood culture (routine x 2)     Status: None (Preliminary result)   Collection Time: 03/20/20 10:32 AM   Specimen: BLOOD  Result Value Ref Range Status   Specimen Description BLOOD RIGHT ANTECUBITAL  Final   Special Requests   Final    BOTTLES DRAWN AEROBIC AND ANAEROBIC Blood Culture adequate volume   Culture   Final    NO GROWTH 2 DAYS Performed at [facility], [address], [place], [place] [address]    Report Status PENDING  Incomplete  Blood culture (routine x 2)     Status: None (Preliminary result)   Collection Time: 03/20/20 10:32 AM   Specimen:  BLOOD  Result Value Ref Range Status   Specimen Description BLOOD LEFT ANTECUBITAL  Final   Special Requests   Final    BOTTLES DRAWN AEROBIC AND ANAEROBIC Blood Culture results may not be optimal due to an excessive volume of blood  received in culture bottles   Culture   Final    NO GROWTH 2 DAYS Performed at [facility], [address] [place], [place] [address]    Report Status PENDING  Incomplete     Total time spend on discharging this patient, including the last patient exam, discussing the hospital stay, instructions for ongoing care as it relates to all pertinent caregivers, as well as preparing the medical discharge records, prescriptions, and/or referrals as applicable, is 35 minutes.    Enzo Bi, MD  [facility] 03/22/2020, 8:55 AM  If 7PM-7AM, please contact night-coverage

## 2020-03-22 NOTE — Progress Notes (Signed)
Notified NP, [place] because patient wanted maintenance fluids stopped b/c he was tired of getting up to the bathroom. I was instructed to document refusal. Patient is now resting comfortably and tolerating regular diet. Will continue to monitor.  Christene Slates  03/22/2020  2:37 AM

## 2020-03-24 LAB — CULTURE, BLOOD (ROUTINE X 2): Culture: NO GROWTH

## 2020-03-25 LAB — CULTURE, BLOOD (ROUTINE X 2)
Culture: NO GROWTH
Culture: NO GROWTH
Special Requests: ADEQUATE

## 2020-05-30 DIAGNOSIS — Z20822 Contact with and (suspected) exposure to covid-19: Secondary | ICD-10-CM | POA: Diagnosis not present

## 2020-05-30 DIAGNOSIS — Z03818 Encounter for observation for suspected exposure to other biological agents ruled out: Secondary | ICD-10-CM | POA: Diagnosis not present

## 2020-09-19 ENCOUNTER — Other Ambulatory Visit: Payer: Self-pay

## 2020-09-19 ENCOUNTER — Ambulatory Visit
Admission: EM | Admit: 2020-09-19 | Discharge: 2020-09-19 | Disposition: A | Discharge status: Home / Self Care | Payer: BC Managed Care – PPO | Attending: Family Medicine | Admitting: Family Medicine

## 2020-09-19 DIAGNOSIS — Z20822 Contact with and (suspected) exposure to covid-19: Secondary | ICD-10-CM

## 2020-09-19 DIAGNOSIS — U071 COVID-19: Secondary | ICD-10-CM | POA: Insufficient documentation

## 2020-09-19 LAB — SARS CORONAVIRUS 2 (TAT 6-24 HRS): SARS Coronavirus 2: POSITIVE — AB

## 2020-09-19 MED ORDER — FLUTICASONE PROPIONATE 50 MCG/ACT NA SUSP
2.0000 | Freq: Every day | NASAL | 0 refills | Status: DC
Start: 2020-09-19 — End: 2020-10-07

## 2020-09-19 NOTE — ED Triage Notes (Signed)
Patient complains of nasal congestion and body aches x Tuesday. States that his son is positive for covid and he would like to be tested for covid.

## 2020-09-19 NOTE — ED Provider Notes (Signed)
[facility]    CSN: [id number] Arrival date & time: 09/19/20  [id number]      History   Chief Complaint Chief Complaint  Patient presents with  . Nasal Congestion    [phone]     HPI Robert Suarez is a 55 y.o. male who presents due to having nose congestion x 2 days. He denies having body aches or HA. His cough is minimal and not bothersome. Denies N/V/D or change in his appetite.   His son tested positive for covid and would like to have a test done.     Past Medical History:  Diagnosis Date  . Arthritis    right knee  . Family history of adverse reaction to anesthesia    son dx'd with malignant hyperthermia 3 yrs ago (after jaw surgery)  . GERD (gastroesophageal reflux disease)   . Hypertension     Patient Active Problem List   Diagnosis Date Noted  . Salmonella gastroenteritis 03/20/2020  . Dysphagia   . Esophageal stricture   . Gastric polyp   . Closed fracture of proximal phalanx of left middle finger 10/16/2019  . Fracture of metacarpal bone 10/16/2019  . Sprain of metacarpophalangeal joint 10/16/2019  . HTN (hypertension) 05/29/2016    Past Surgical History:  Procedure Laterality Date  . COLONOSCOPY WITH PROPOFOL N/A 07/30/2016   Procedure: COLONOSCOPY WITH PROPOFOL;  Surgeon: Wyline MoodKiran Anna, MD;  Location: [facility] ENDOSCOPY;  Service: Endoscopy;  Laterality: N/A;  . ESOPHAGOGASTRODUODENOSCOPY (EGD) WITH PROPOFOL N/A 10/23/2019   Procedure: ESOPHAGOGASTRODUODENOSCOPY (EGD) WITH PROPOFOL;  Surgeon: Midge MiniumWohl, Darren, MD;  [facility];  Service: Endoscopy;  Laterality: N/A;  . ligament repair Right   . NO PAST SURGERIES         Home Medications    Prior to Admission medications   Medication Sig Start Date End Date Taking? Authorizing Provider  amLODipine (NORVASC) 5 MG tablet Take 1 tablet (5 mg total) by mouth daily. 07/18/19 07/17/20 Yes Verlee MonteGray, Bryan E, NP  lisinopril (ZESTRIL) 10 MG tablet Take 1 tablet (10 mg total) by mouth 2 (two) times  daily. 07/18/19  Yes Verlee MonteGray, Bryan E, NP  pantoprazole (PROTONIX) 40 MG tablet Take 1 tablet (40 mg total) by mouth daily. 10/16/19   Midge MiniumWohl, Darren, MD    Family History Family History  Problem Relation Age of Onset  . Healthy Mother   . Healthy Father     Social History Social History   Tobacco Use  . Smoking status: Never Smoker  . Smokeless tobacco: Never Used  Vaping Use  . Vaping Use: Never used  Substance Use Topics  . Alcohol use: No  . Drug use: No     Allergies   Patient has no known allergies.   Review of Systems Review of Systems  Constitutional: Negative for activity change, appetite change, chills, diaphoresis, fatigue and fever.  HENT: Positive for congestion and postnasal drip. Negative for ear discharge, ear pain, sore throat and trouble swallowing.   Eyes: Negative for discharge.  Respiratory: Positive for cough. Negative for choking and shortness of breath.   Cardiovascular: Negative for chest pain.  Gastrointestinal: Negative for diarrhea, nausea and vomiting.  Musculoskeletal: Negative for gait problem and myalgias.  Skin: Negative for rash.  Neurological: Positive for syncope. Negative for headaches.  Hematological: Negative for adenopathy.    Physical Exam Triage Vital Signs ED Triage Vitals  Enc Vitals Group     BP 09/19/20 1028 (!) 150/105     Pulse Rate 09/19/20 1028  80     Resp 09/19/20 1028 16     Temp 09/19/20 1028 99.3 F (37.4 C)     Temp Source 09/19/20 1028 Oral     SpO2 09/19/20 1028 98 %     Weight 09/19/20 1006 200 lb (90.7 kg)     Height 09/19/20 1006 5\' 10"  (1.778 m)     Head Circumference --      Peak Flow --      Pain Score 09/19/20 1006 6     Pain Loc --      Pain Edu? --      Excl. in [place]? --    No data found.  Updated Vital Signs BP (!) 150/105 (BP Location: Right Arm)   Pulse 80   Temp 99.3 F (37.4 C) (Oral)   Resp 16   Ht 5\' 10"  (1.778 m)   Wt 200 lb (90.7 kg)   SpO2 98%   BMI 28.70 kg/m   Visual  Acuity Right Eye Distance:   Left Eye Distance:   Bilateral Distance:    Right Eye Near:   Left Eye Near:    Bilateral Near:     Physical Exam Physical Exam Vitals signs and nursing note reviewed.  Constitutional:      General: he is not in acute distress.    Appearance: Normal appearance. he is not ill-appearing, toxic-appearing or diaphoretic.  HENT:     Head: Normocephalic.     Right Ear: Tympanic membrane, ear canal and external ear normal.     Left Ear: Tympanic membrane, ear canal and external ear normal.     Nose: with moderate congestion and clear mucous   Mouth/Throat:     Mouth: Mucous membranes are moist.  Eyes:     General: No scleral icterus.       Right eye: No discharge.        Left eye: No discharge.     Conjunctiva/sclera: Conjunctivae normal.  Neck:     Musculoskeletal: Neck supple. No neck rigidity.  Cardiovascular:     Rate and Rhythm: Normal rate and regular rhythm.     Heart sounds: No murmur.  Pulmonary:     Effort: Pulmonary effort is normal.     Breath sounds: Normal breath sounds.   Musculoskeletal: Normal range of motion.  Lymphadenopathy:     Cervical: No cervical adenopathy.  Skin:    General: Skin is warm and dry.     Coloration: Skin is not jaundiced.     Findings: No rash.  Neurological:     Mental Status: he is alert and oriented to person, place, and time.     Gait: Gait normal.  Psychiatric:        Mood and Affect: Mood normal.        Behavior: Behavior normal.        Thought Content: Thought content normal.        Judgment: Judgment normal.     UC Treatments / Results  Labs (all labs ordered are listed, but only abnormal results are displayed) Labs Reviewed  SARS CORONAVIRUS 2 (TAT 6-24 HRS)    EKG   Radiology No results found.  Procedures Procedures (including critical care time)  Medications Ordered in UC Medications - No data to display  Initial Impression / Assessment and Plan / UC Course  I have reviewed  the triage vital signs and the nursing notes. Covid test is pending. Has early URI. I sent Flonase to help with his  symptoms. See instructions.    Final Clinical Impressions(s) / UC Diagnoses   Final diagnoses:  None   Discharge Instructions   None    ED Prescriptions    None     PDMP not reviewed this encounter.   Garey Ham, PA-C 09/19/20 1048

## 2020-09-20 ENCOUNTER — Telehealth: Payer: Self-pay

## 2020-09-20 NOTE — Telephone Encounter (Signed)
Patient was advise on positive results per protocol.   Patient notified of positive COVID-19 test results. Pt verbalized understanding. Pt reports no symptoms Criteria for self-isolation:  -Please quarantine and isolate at home  for at least 10 days since symptoms started AND - At least 24 hours fever free without the use of fever reducing medications such as Tylenol or Ibuprofen AND - Improvement in respiratory symptoms Use over-the-counter medications for symptoms.If you develop respiratory issues/distress, seek medical care in the Emergency Department.  If you must leave home or if you have to be around others please wear a mask. Please limit contact with immediate family members in the home, practice social distancing, frequent handwashing and clean hard surfaces touched frequently with household cleaning products. Members of your household will also need to quarantine and test. Pt informed that the health department will likely follow up and may have additional recommendations.

## 2020-10-07 ENCOUNTER — Encounter: Payer: Self-pay | Admitting: Emergency Medicine

## 2020-10-07 ENCOUNTER — Ambulatory Visit
Admission: EM | Admit: 2020-10-07 | Discharge: 2020-10-07 | Disposition: A | Discharge status: Home / Self Care | Payer: BC Managed Care – PPO | Attending: Emergency Medicine | Admitting: Emergency Medicine

## 2020-10-07 ENCOUNTER — Other Ambulatory Visit: Payer: Self-pay

## 2020-10-07 DIAGNOSIS — I10 Essential (primary) hypertension: Secondary | ICD-10-CM

## 2020-10-07 DIAGNOSIS — R1319 Other dysphagia: Secondary | ICD-10-CM | POA: Diagnosis not present

## 2020-10-07 DIAGNOSIS — H1033 Unspecified acute conjunctivitis, bilateral: Secondary | ICD-10-CM

## 2020-10-07 MED ORDER — LISINOPRIL 10 MG PO TABS: 10.0000 mg | ORAL_TABLET | Freq: Two times a day (BID) | ORAL | 1 refills | Status: DC

## 2020-10-07 MED ORDER — AMLODIPINE BESYLATE 5 MG PO TABS
5.0000 mg | ORAL_TABLET | Freq: Every day | ORAL | 0 refills | Status: DC
Start: 2020-10-07 — End: 2021-07-18

## 2020-10-07 MED ORDER — MOXIFLOXACIN HCL 0.5 % OP SOLN: 1.0000 [drp] | Freq: Three times a day (TID) | OPHTHALMIC | 0 refills | Status: DC

## 2020-10-07 NOTE — Discharge Instructions (Addendum)
Take the Vigamox 3 times a day to treat your conjunctivitis.  Continue taking your lisinopril as prescribed.  Make a follow-up appointment with gastroenterology to discuss your difficulty with swallowing as you will need another scope and possible esophageal dilation.  If you feel like food becomes stuck and you are unable to swallow or you choke when you swallow fluids you need to go the ER for evaluation.

## 2020-10-07 NOTE — ED Triage Notes (Signed)
Pt presents today with c/o of bilateral eye redness and itching x 2 days. Denies pain/swelling/injury or drainage. Also, concerned about difficulty swallowing at times. History of having esophagus stretched recently.

## 2020-10-07 NOTE — ED Provider Notes (Signed)
[facility]    CSN: [id number] Arrival date & time: 10/07/20  [id number]      History   Chief Complaint Chief Complaint  Patient presents with  . Dysphagia  . eye redness    bilateral    HPI Robert Suarez is a 55 y.o. male.   HPI   55 year old male here for evaluation of multiple complaints.  His primary complaint is that he has had redness, itching, and discharge from both his eyes for last 2 days.  Patient states that he wakes up and he has got crusty discharge on his lashes.  Patient denies any eye pain or visual changes.  Patient denies discharge throughout the day.  Patient has recently been around his granddaughter but she has not had similar symptoms.  His second complaint is that he has been feeling like food has been getting stuck when he swallows for the last 1 to 2 months.  Patient has had this condition for a long time and October 23, 2019 had his esophagus stretched for the same reason.  Patient able to swallow liquids without difficulty prior to exam.  Past Medical History:  Diagnosis Date  . Arthritis    right knee  . Family history of adverse reaction to anesthesia    son dx'd with malignant hyperthermia 3 yrs ago (after jaw surgery)  . GERD (gastroesophageal reflux disease)   . Hypertension     Patient Active Problem List   Diagnosis Date Noted  . Salmonella gastroenteritis 03/20/2020  . Dysphagia   . Esophageal stricture   . Gastric polyp   . Closed fracture of proximal phalanx of left middle finger 10/16/2019  . Fracture of metacarpal bone 10/16/2019  . Sprain of metacarpophalangeal joint 10/16/2019  . HTN (hypertension) 05/29/2016    Past Surgical History:  Procedure Laterality Date  . COLONOSCOPY WITH PROPOFOL N/A 07/30/2016   Procedure: COLONOSCOPY WITH PROPOFOL;  Surgeon: Jonathon Bellows, MD;  Location: [facility] ENDOSCOPY;  Service: Endoscopy;  Laterality: N/A;  . ESOPHAGOGASTRODUODENOSCOPY (EGD) WITH PROPOFOL N/A 10/23/2019   Procedure:  ESOPHAGOGASTRODUODENOSCOPY (EGD) WITH PROPOFOL;  Surgeon: Lucilla Lame, MD;  Location: [facility] ENDOSCOPY;  Service: Endoscopy;  Laterality: N/A;  . ligament repair Right   . NO PAST SURGERIES         Home Medications    Prior to Admission medications   Medication Sig Start Date End Date Taking? Authorizing Provider  moxifloxacin (VIGAMOX) 0.5 % ophthalmic solution Place 1 drop into both eyes 3 (three) times daily. 10/07/20  Yes Margarette Canada, NP  lisinopril (ZESTRIL) 10 MG tablet Take 1 tablet (10 mg total) by mouth 2 (two) times daily. 10/07/20   Margarette Canada, NP  amLODipine (NORVASC) 5 MG tablet Take 1 tablet (5 mg total) by mouth daily. 07/18/19 10/07/20  Karen Kitchens, NP  fluticasone (FLONASE) 50 MCG/ACT nasal spray Place 2 sprays into both nostrils daily. 09/19/20 10/07/20  Rodriguez-Southworth, Sunday Spillers, PA-C  pantoprazole (PROTONIX) 40 MG tablet Take 1 tablet (40 mg total) by mouth daily. 10/16/19 09/19/20  Lucilla Lame, MD    Family History Family History  Problem Relation Age of Onset  . Healthy Mother   . Healthy Father     Social History Social History   Tobacco Use  . Smoking status: Never Smoker  . Smokeless tobacco: Never Used  Vaping Use  . Vaping Use: Never used  Substance Use Topics  . Alcohol use: No  . Drug use: No     Allergies   Patient has  no known allergies.   Review of Systems Review of Systems  Constitutional: Negative for activity change, appetite change and fever.  HENT: Positive for trouble swallowing. Negative for sore throat.   Eyes: Positive for discharge, redness and itching. Negative for photophobia and visual disturbance.  Respiratory: Negative for shortness of breath.   Skin: Negative for rash.  Hematological: Negative.   Psychiatric/Behavioral: Negative.      Physical Exam Triage Vital Signs ED Triage Vitals [10/07/20 0836]  Enc Vitals Group     BP (!) 152/104     Pulse Rate 82     Resp 20     Temp 98.8 F (37.1 C)     Temp Source  Oral     SpO2 99 %     Weight 200 lb (90.7 kg)     Height 5\' 10"  (1.778 m)     Head Circumference      Peak Flow      Pain Score 0     Pain Loc      Pain Edu?      Excl. in [place]?    No data found.  Updated Vital Signs BP (!) 152/104 (BP Location: Left Arm)   Pulse 82   Temp 98.8 F (37.1 C) (Oral)   Resp 20   Ht 5\' 10"  (1.778 m)   Wt 200 lb (90.7 kg)   SpO2 99%   BMI 28.70 kg/m   Visual Acuity Right Eye Distance:   Left Eye Distance:   Bilateral Distance:    Right Eye Near:   Left Eye Near:    Bilateral Near:     Physical Exam Vitals and nursing note reviewed.  Constitutional:      General: He is not in acute distress.    Appearance: Normal appearance.  HENT:     Head: Normocephalic and atraumatic.  Eyes:     General: No scleral icterus.    Extraocular Movements: Extraocular movements intact.     Pupils: Pupils are equal, round, and reactive to light.     Comments: Bulbar employable conjunctiva are erythematous and injected bilaterally.  Skin:    General: Skin is warm and dry.     Capillary Refill: Capillary refill takes less than 2 seconds.     Findings: No erythema or rash.  Neurological:     General: No focal deficit present.     Mental Status: He is alert and oriented to person, place, and time.  Psychiatric:        Mood and Affect: Mood normal.        Behavior: Behavior normal.        Thought Content: Thought content normal.        Judgment: Judgment normal.      UC Treatments / Results  Labs (all labs ordered are listed, but only abnormal results are displayed) Labs Reviewed - No data to display  EKG   Radiology No results found.  Procedures Procedures (including critical care time)  Medications Ordered in UC Medications - No data to display  Initial Impression / Assessment and Plan / UC Course  I have reviewed the triage vital signs and the nursing notes.  Pertinent labs & imaging results that were available during my care of the  patient were reviewed by me and considered in my medical decision making (see chart for details).   Patient is here for evaluation of conjunctivitis symptoms in both eyes that started 2 days ago.  His bulbar and palpebral conjunctiva  are erythematous and injected.  There is no discharge on his lashes are in either inner or outer canthus.  Patient symptoms are consistent with conjunctivitis.  Will treat with Vigamox 3 times daily x7 days.  Patient second complaint is that he has been feeling like he has had difficulty swallowing proteins, and on occasion fluids, the last 1 to 2 months.  Patient has a longstanding history of esophageal stricture and has had it stretched twice.  His last esophageal dilatation was October 23, 2019.  Patient given 8 ounces of water in clinic which she consumed without difficulty.  Will have patient follow-up with gastroenterology for further diagnosis and treatment.  Patient also requesting a refill of his blood pressure medication.   Final Clinical Impressions(s) / UC Diagnoses   Final diagnoses:  Acute conjunctivitis of both eyes, unspecified acute conjunctivitis type  Esophageal dysphagia  Essential hypertension     Discharge Instructions     Take the Vigamox 3 times a day to treat your conjunctivitis.  Continue taking your lisinopril as prescribed.  Make a follow-up appointment with gastroenterology to discuss your difficulty with swallowing as you will need another scope and possible esophageal dilation.  If you feel like food becomes stuck and you are unable to swallow or you choke when you swallow fluids you need to go the ER for evaluation.    ED Prescriptions    Medication Sig Dispense Auth. Provider   lisinopril (ZESTRIL) 10 MG tablet Take 1 tablet (10 mg total) by mouth 2 (two) times daily. 30 tablet Margarette Canada, NP   moxifloxacin (VIGAMOX) 0.5 % ophthalmic solution Place 1 drop into both eyes 3 (three) times daily. 3 mL Margarette Canada, NP      PDMP not reviewed this encounter.   Margarette Canada, NP 10/07/20 [id number]

## 2021-01-15 ENCOUNTER — Other Ambulatory Visit: Payer: Self-pay

## 2021-01-15 ENCOUNTER — Ambulatory Visit
Admission: EM | Admit: 2021-01-15 | Discharge: 2021-01-15 | Disposition: A | Discharge status: Home / Self Care | Payer: BC Managed Care – PPO | Attending: Physician Assistant | Admitting: Physician Assistant

## 2021-01-15 ENCOUNTER — Encounter: Payer: Self-pay | Admitting: Emergency Medicine

## 2021-01-15 DIAGNOSIS — R059 Cough, unspecified: Secondary | ICD-10-CM

## 2021-01-15 DIAGNOSIS — J309 Allergic rhinitis, unspecified: Secondary | ICD-10-CM

## 2021-01-15 DIAGNOSIS — R0981 Nasal congestion: Secondary | ICD-10-CM | POA: Diagnosis not present

## 2021-01-15 MED ORDER — IPRATROPIUM BROMIDE 0.06 % NA SOLN: 2.0000 | Freq: Four times a day (QID) | NASAL | 0 refills | Status: DC

## 2021-01-15 NOTE — ED Triage Notes (Signed)
Pt is present today with a sore throat, nasal congestion, and a cough. Pt states that his sx started last saturday

## 2021-01-15 NOTE — Discharge Instructions (Signed)
Keep taking the Claritin regularly.  I have sent a nasal spray for you as well.  Increase rest and fluids.  You should return if any symptoms worsen, especially if you develop a fever, worsening cough, sinus pain or ear pain or if you develop chest pain or breathing difficulty.

## 2021-01-15 NOTE — ED Provider Notes (Signed)
[facility]    CSN: [id number] Arrival date & time: 01/15/21  1520      History   Chief Complaint Chief Complaint  Patient presents with  . Sore Throat  . Cough    HPI Robert Suarez is a 55 y.o. male presenting for approximately 5-day history of sore throat, nasal congestion/runny nose, postnasal drainage, cough, and itchy watery eyes.  Patient says he thinks his allergies are flaring up.  He took Claritin starting today.  He denies any fevers or fatigue or body aches.  No sinus pain, ear pain or chest pain.  No breathing difficulty.  Patient denies any sick contacts and no known exposure to COVID-19.  Reports past medical history of COVID-19 infection 4 months ago.  No other concerns today.  HPI  Past Medical History:  Diagnosis Date  . Arthritis    right knee  . Family history of adverse reaction to anesthesia    son dx'd with malignant hyperthermia 3 yrs ago (after jaw surgery)  . GERD (gastroesophageal reflux disease)   . Hypertension     Patient Active Problem List   Diagnosis Date Noted  . Salmonella gastroenteritis 03/20/2020  . Dysphagia   . Esophageal stricture   . Gastric polyp   . Closed fracture of proximal phalanx of left middle finger 10/16/2019  . Fracture of metacarpal bone 10/16/2019  . Sprain of metacarpophalangeal joint 10/16/2019  . HTN (hypertension) 05/29/2016    Past Surgical History:  Procedure Laterality Date  . COLONOSCOPY WITH PROPOFOL N/A 07/30/2016   Procedure: COLONOSCOPY WITH PROPOFOL;  Surgeon: Jonathon Bellows, MD;  Location: [facility] ENDOSCOPY;  Service: Endoscopy;  Laterality: N/A;  . ESOPHAGOGASTRODUODENOSCOPY (EGD) WITH PROPOFOL N/A 10/23/2019   Procedure: ESOPHAGOGASTRODUODENOSCOPY (EGD) WITH PROPOFOL;  Surgeon: Lucilla Lame, MD;  Location: [facility] ENDOSCOPY;  Service: Endoscopy;  Laterality: N/A;  . ligament repair Right   . NO PAST SURGERIES         Home Medications    Prior to Admission medications   Medication Sig Start  Date End Date Taking? Authorizing Provider  ipratropium (ATROVENT) 0.06 % nasal spray Place 2 sprays into both nostrils 4 (four) times daily for 7 days. 01/15/21 01/22/21 Yes Laurene Footman B, PA-C  amLODipine (NORVASC) 5 MG tablet Take 1 tablet (5 mg total) by mouth daily. 10/07/20   Margarette Canada, NP  lisinopril (ZESTRIL) 10 MG tablet Take 1 tablet (10 mg total) by mouth 2 (two) times daily. 10/07/20   Margarette Canada, NP  moxifloxacin (VIGAMOX) 0.5 % ophthalmic solution Place 1 drop into both eyes 3 (three) times daily. 10/07/20   Margarette Canada, NP  fluticasone [facility]) 50 MCG/ACT nasal spray Place 2 sprays into both nostrils daily. 09/19/20 10/07/20  Rodriguez-Southworth, Sunday Spillers, PA-C  pantoprazole (PROTONIX) 40 MG tablet Take 1 tablet (40 mg total) by mouth daily. 10/16/19 09/19/20  Lucilla Lame, MD    Family History Family History  Problem Relation Age of Onset  . Healthy Mother   . Healthy Father     Social History Social History   Tobacco Use  . Smoking status: Never Smoker  . Smokeless tobacco: Never Used  Vaping Use  . Vaping Use: Never used  Substance Use Topics  . Alcohol use: No  . Drug use: No     Allergies   Patient has no known allergies.   Review of Systems Review of Systems  Constitutional: Negative for fatigue and fever.  HENT: Positive for congestion, postnasal drip, rhinorrhea and sore throat. Negative for  sinus pressure and sinus pain.   Eyes: Positive for itching.  Respiratory: Positive for cough. Negative for shortness of breath.   Cardiovascular: Negative for chest pain.  Gastrointestinal: Negative for abdominal pain, diarrhea, nausea and vomiting.  Musculoskeletal: Negative for myalgias.  Neurological: Negative for weakness, light-headedness and headaches.  Hematological: Negative for adenopathy.     Physical Exam Triage Vital Signs ED Triage Vitals  Enc Vitals Group     BP 01/15/21 1534 (!) 151/108     Pulse Rate 01/15/21 1534 87     Resp 01/15/21 1534  18     Temp 01/15/21 1534 98.8 F (37.1 C)     Temp Source 01/15/21 1534 Oral     SpO2 01/15/21 1534 97 %     Weight --      Height --      Head Circumference --      Peak Flow --      Pain Score 01/15/21 1532 6     Pain Loc --      Pain Edu? --      Excl. in [place]? --    No data found.  Updated Vital Signs BP (!) 151/108 (BP Location: Left Arm)   Pulse 87   Temp 98.8 F (37.1 C) (Oral)   Resp 18   SpO2 97%       Physical Exam Vitals and nursing note reviewed.  Constitutional:      General: He is not in acute distress.    Appearance: Normal appearance. He is well-developed. He is not ill-appearing or diaphoretic.  HENT:     Head: Normocephalic and atraumatic.     Nose: Congestion and rhinorrhea present.     Mouth/Throat:     Mouth: Mucous membranes are moist.     Pharynx: Oropharynx is clear. Uvula midline. Posterior oropharyngeal erythema present. No oropharyngeal exudate.     Tonsils: No tonsillar abscesses.  Eyes:     General: No scleral icterus.       Right eye: No discharge.        Left eye: No discharge.     Conjunctiva/sclera: Conjunctivae normal.  Neck:     Thyroid: No thyromegaly.     Trachea: No tracheal deviation.  Cardiovascular:     Rate and Rhythm: Normal rate and regular rhythm.     Heart sounds: Normal heart sounds.  Pulmonary:     Effort: Pulmonary effort is normal. No respiratory distress.     Breath sounds: Normal breath sounds. No wheezing, rhonchi or rales.  Musculoskeletal:     Cervical back: Neck supple.  Lymphadenopathy:     Cervical: No cervical adenopathy.  Skin:    General: Skin is warm and dry.     Findings: No rash.  Neurological:     General: No focal deficit present.     Mental Status: He is alert. Mental status is at baseline.     Motor: No weakness.     Gait: Gait normal.  Psychiatric:        Mood and Affect: Mood normal.        Behavior: Behavior normal.        Thought Content: Thought content normal.      UC  Treatments / Results  Labs (all labs ordered are listed, but only abnormal results are displayed) Labs Reviewed - No data to display  EKG   Radiology No results found.  Procedures Procedures (including critical care time)  Medications Ordered in UC Medications - No  data to display  Initial Impression / Assessment and Plan / UC Course  I have reviewed the triage vital signs and the nursing notes.  Pertinent labs & imaging results that were available during my care of the patient were reviewed by me and considered in my medical decision making (see chart for details).   55 year old male presenting with 5-day history of cough, congestion, postnasal drainage, runny nose, cough and itchy watery eyes.  Clinical presentation is most consistent with allergies and he does have a history of allergic rhinitis.  Advised him to continue taking the Claritin and I have also sent Atrovent nasal spray.  Encouraged increasing rest and fluids.  Advised him to follow-up with [place] for any worsening symptoms or if he is not improving with these medications.   Final Clinical Impressions(s) / UC Diagnoses   Final diagnoses:  Allergic rhinitis, unspecified seasonality, unspecified trigger  Nasal congestion  Cough     Discharge Instructions     Keep taking the Claritin regularly.  I have sent a nasal spray for you as well.  Increase rest and fluids.  You should return if any symptoms worsen, especially if you develop a fever, worsening cough, sinus pain or ear pain or if you develop chest pain or breathing difficulty.   ED Prescriptions    Medication Sig Dispense Auth. Provider   ipratropium (ATROVENT) 0.06 % nasal spray Place 2 sprays into both nostrils 4 (four) times daily for 7 days. 15 mL Danton Clap, PA-C     PDMP not reviewed this encounter.   Danton Clap, PA-C 01/15/21 [id number]

## 2021-05-21 ENCOUNTER — Emergency Department
Admission: EM | Admit: 2021-05-21 | Discharge: 2021-05-21 | Disposition: A | Discharge status: Home / Self Care | Payer: BC Managed Care – PPO | Attending: Emergency Medicine | Admitting: Emergency Medicine

## 2021-05-21 ENCOUNTER — Other Ambulatory Visit: Payer: Self-pay

## 2021-05-21 ENCOUNTER — Encounter: Payer: Self-pay | Admitting: Emergency Medicine

## 2021-05-21 ENCOUNTER — Emergency Department: Payer: BC Managed Care – PPO

## 2021-05-21 DIAGNOSIS — Z79899 Other long term (current) drug therapy: Secondary | ICD-10-CM | POA: Diagnosis not present

## 2021-05-21 DIAGNOSIS — S63681A Other sprain of right thumb, initial encounter: Secondary | ICD-10-CM | POA: Insufficient documentation

## 2021-05-21 DIAGNOSIS — Y990 Civilian activity done for income or pay: Secondary | ICD-10-CM | POA: Insufficient documentation

## 2021-05-21 DIAGNOSIS — X509XXA Other and unspecified overexertion or strenuous movements or postures, initial encounter: Secondary | ICD-10-CM | POA: Diagnosis not present

## 2021-05-21 DIAGNOSIS — I10 Essential (primary) hypertension: Secondary | ICD-10-CM | POA: Insufficient documentation

## 2021-05-21 DIAGNOSIS — M19041 Primary osteoarthritis, right hand: Secondary | ICD-10-CM | POA: Diagnosis not present

## 2021-05-21 DIAGNOSIS — M79644 Pain in right finger(s): Secondary | ICD-10-CM | POA: Insufficient documentation

## 2021-05-21 DIAGNOSIS — S60931A Unspecified superficial injury of right thumb, initial encounter: Secondary | ICD-10-CM | POA: Diagnosis not present

## 2021-05-21 MED ORDER — NAPROXEN 500 MG PO TABS
500.0000 mg | ORAL_TABLET | Freq: Two times a day (BID) | ORAL | 0 refills | Status: AC
Start: 2021-05-21 — End: 2021-06-20

## 2021-05-21 MED ORDER — NAPROXEN 500 MG PO TABS
500.0000 mg | ORAL_TABLET | Freq: Once | ORAL | Status: AC
  Administered 2021-05-21: 500 mg via ORAL
  Filled 2021-05-21: qty 1

## 2021-05-21 NOTE — ED Provider Notes (Signed)
[facility] Emergency Department Provider Note  ____________________________________________   Event Date/Time   First MD Initiated Contact with Patient 05/21/21 [phone]     (approximate)  I have reviewed the triage vital signs and the nursing notes.   HISTORY  Chief Complaint Thumb Pain    HPI Robert Suarez is a 55 y.o. male right-hand-dominant male with history of hypertension who presents to the emergency department with a right thumb injury.  States that he hyperextended it today and is having pain with flexion and extension.  Has had previous surgery to this thumb in 2015 by Dr. Mack Guise.  Has also had previous sprain to this thumb that improved with anti-inflammatories and a thumb spica.        Past Medical History:  Diagnosis Date   Arthritis    right knee   Family history of adverse reaction to anesthesia    son dx'd with malignant hyperthermia 3 yrs ago (after jaw surgery)   GERD (gastroesophageal reflux disease)    Hypertension     Patient Active Problem List   Diagnosis Date Noted   Salmonella gastroenteritis 03/20/2020   Dysphagia    Esophageal stricture    Gastric polyp    Closed fracture of proximal phalanx of left middle finger 10/16/2019   Fracture of metacarpal bone 10/16/2019   Sprain of metacarpophalangeal joint 10/16/2019   HTN (hypertension) 05/29/2016    Past Surgical History:  Procedure Laterality Date   COLONOSCOPY WITH PROPOFOL N/A 07/30/2016   Procedure: COLONOSCOPY WITH PROPOFOL;  Surgeon: Jonathon Bellows, MD;  Location: [facility] ENDOSCOPY;  Service: Endoscopy;  Laterality: N/A;   ESOPHAGOGASTRODUODENOSCOPY (EGD) WITH PROPOFOL N/A 10/23/2019   Procedure: ESOPHAGOGASTRODUODENOSCOPY (EGD) WITH PROPOFOL;  Surgeon: Lucilla Lame, MD;  Location: [facility] ENDOSCOPY;  Service: Endoscopy;  Laterality: N/A;   ligament repair Right    NO PAST SURGERIES      Prior to Admission medications   Medication Sig Start Date End Date Taking?  Authorizing Provider  naproxen (NAPROSYN) 500 MG tablet Take 1 tablet (500 mg total) by mouth 2 (two) times daily with a meal. 05/21/21 06/20/21 Yes Teagon Kron N, DO  amLODipine (NORVASC) 5 MG tablet Take 1 tablet (5 mg total) by mouth daily. 10/07/20   Margarette Canada, NP  ipratropium (ATROVENT) 0.06 % nasal spray Place 2 sprays into both nostrils 4 (four) times daily for 7 days. 01/15/21 01/22/21  Laurene Footman B, PA-C  lisinopril (ZESTRIL) 10 MG tablet Take 1 tablet (10 mg total) by mouth 2 (two) times daily. 10/07/20   Margarette Canada, NP  moxifloxacin (VIGAMOX) 0.5 % ophthalmic solution Place 1 drop into both eyes 3 (three) times daily. 10/07/20   Margarette Canada, NP  fluticasone [facility]) 50 MCG/ACT nasal spray Place 2 sprays into both nostrils daily. 09/19/20 10/07/20  Rodriguez-Southworth, Sunday Spillers, PA-C  pantoprazole (PROTONIX) 40 MG tablet Take 1 tablet (40 mg total) by mouth daily. 10/16/19 09/19/20  Lucilla Lame, MD    Allergies Patient has no known allergies.  Family History  Problem Relation Age of Onset   Healthy Mother    Healthy Father     Social History Social History   Tobacco Use   Smoking status: Never   Smokeless tobacco: Never  Vaping Use   Vaping Use: Never used  Substance Use Topics   Alcohol use: No   Drug use: No    Review of Systems Constitutional: No fever. Eyes: No visual changes. ENT: No sore throat. Cardiovascular: Denies chest pain. Respiratory: Denies shortness  of breath. Gastrointestinal: No nausea, vomiting, diarrhea. Genitourinary: Negative for dysuria. Musculoskeletal: Negative for back pain. Skin: Negative for rash. Neurological: Negative for focal weakness or numbness.  ____________________________________________   PHYSICAL EXAM:  VITAL SIGNS: ED Triage Vitals  Enc Vitals Group     BP 05/21/21 0035 (!) 170/123     Pulse Rate 05/21/21 0035 84     Resp 05/21/21 0035 18     Temp 05/21/21 0035 98.8 F (37.1 C)     Temp Source 05/21/21 0035 Oral      SpO2 05/21/21 0035 96 %     Weight 05/21/21 0035 200 lb (90.7 kg)     Height 05/21/21 0035 '5\' 10"'$  (1.778 m)     Head Circumference --      Peak Flow --      Pain Score 05/21/21 0036 6     Pain Loc --      Pain Edu? --      Excl. in [place]? --    CONSTITUTIONAL: Alert and responds appropriately to questions. Well-appearing; well-nourished HEAD: Normocephalic, atraumatic EYES: Conjunctivae clear, pupils appear equal ENT: normal nose; moist mucous membranes NECK: Normal range of motion CARD: Regular rate and rhythm RESP: Normal chest excursion without splinting or tachypnea; no hypoxia or respiratory distress, speaking full sentences ABD/GI: non-distended EXT: Patient has tenderness to palpation over the proximal right thumb without soft tissue swelling, redness, warmth, ecchymosis.  He is able to flex and extend, abduct and adduct his right thumb but it causes him pain especially with flexion.  He has normal capillary refill.  2+ right radial pulse.  Compartments soft.  No bony deformity noted. SKIN: Normal color for age and race, no rashes on exposed skin NEURO: Moves all extremities equally, normal speech, no facial asymmetry noted PSYCH: The patient's mood and manner are appropriate. Grooming and personal hygiene are appropriate.  ____________________________________________   LABS (all labs ordered are listed, but only abnormal results are displayed)  Labs Reviewed - No data to display ____________________________________________  EKG   ____________________________________________  RADIOLOGY I, Dae Antonucci, personally viewed and evaluated these images (plain radiographs) as part of my medical decision making, as well as reviewing the written report by the radiologist.  ED MD interpretation: No acute fracture.  Official radiology report(s): DG Finger Thumb Right  Result Date: 05/21/2021 CLINICAL DATA:  Right thumb pain. EXAM: RIGHT THUMB 2+V COMPARISON:  None. FINDINGS:  There is no evidence of fracture or dislocation. Normal alignment and joint spaces. Minimal osteophytes involving the interphalangeal joint and carpal metacarpal joint. No erosion or bone destruction. Soft tissues are unremarkable. IMPRESSION: Minimal osteoarthritis.  No acute findings. Electronically Signed   By: Keith Rake M.D.   On: 05/21/2021 01:03    ____________________________________________   PROCEDURES  Procedure(s) performed (including Critical Care):  Procedures  SPLINT APPLICATION Date/Time: [id number] AM Authorized by: Cyril Mourning Marguerite Barba Consent: Verbal consent obtained. Risks and benefits: risks, benefits and alternatives were discussed Consent given by: patient Splint applied by: nurse Location details: Right hand Splint type: Thumb spica Supplies used: Thumb spica Post-procedure: The splinted body part was neurovascularly unchanged following the procedure. Patient tolerance: Patient tolerated the procedure well with no immediate complications.    ____________________________________________   INITIAL IMPRESSION / ASSESSMENT AND PLAN / ED COURSE  As part of my medical decision making, I reviewed the following data within the [facility] notes reviewed and incorporated, Old chart reviewed, Radiograph reviewed , and Notes from prior ED visits  Patient with sprain of the right thumb.  History of the same and previous ligamentous injury requiring surgical intervention.  X-ray shows no acute abnormality today.  Recommended close follow-up with his orthopedist.  Will place in thumb spica and recommended light duty for the next week.  Recommended rest, elevation, ice and will discharge with prescription for naproxen.  Patient neurovascularly intact distally.  No signs of infectious etiology, gout.  At this time, I do not feel there is any life-threatening condition present. I have reviewed, interpreted and discussed all results (EKG, imaging,  lab, urine as appropriate) and exam findings with patient/family. I have reviewed nursing notes and appropriate previous records.  I feel the patient is safe to be discharged home without further emergent workup and can continue workup as an outpatient as needed. Discussed usual and customary return precautions. Patient/family verbalize understanding and are comfortable with this plan.  Outpatient follow-up has been provided as needed. All questions have been answered.     ____________________________________________   FINAL CLINICAL IMPRESSION(S) / ED DIAGNOSES  Final diagnoses:  Other sprain of right thumb, initial encounter     ED Discharge Orders          Ordered    naproxen (NAPROSYN) 500 MG tablet  2 times daily with meals        05/21/21 0513            *Please note:  Robert Suarez was evaluated in Emergency Department on 05/21/2021 for the symptoms described in the history of present illness. He was evaluated in the context of the global COVID-19 pandemic, which necessitated consideration that the patient might be at risk for infection with the SARS-CoV-2 virus that causes COVID-19. Institutional protocols and algorithms that pertain to the evaluation of patients at risk for COVID-19 are in a state of rapid change based on information released by regulatory bodies including the CDC and federal and state organizations. These policies and algorithms were followed during the patient's care in the ED.  Some ED evaluations and interventions may be delayed as a result of limited staffing during and the pandemic.*   Note:  This document was prepared using Dragon voice recognition software and may include unintentional dictation errors.    Yarissa Reining, Delice Bison, DO 05/21/21 [phone]

## 2021-05-21 NOTE — ED Notes (Signed)
E-Sign not working. Printed copy signed and placed into medical records.

## 2021-05-21 NOTE — ED Triage Notes (Signed)
Pt arrived via POV with reports of R thumb pain, pt states he was picking something up at work and felt a pain through this hand and thumb up his arm.  Pt has hx of injuring the same hand and arm at work, pt states the feeling he got was similar to that pain before.  Pt states the pain is more in the thumb at this time.

## 2021-05-24 ENCOUNTER — Other Ambulatory Visit: Payer: Self-pay

## 2021-05-24 ENCOUNTER — Ambulatory Visit
Admission: EM | Admit: 2021-05-24 | Discharge: 2021-05-24 | Disposition: A | Discharge status: Home / Self Care | Payer: BC Managed Care – PPO | Attending: Family Medicine | Admitting: Family Medicine

## 2021-05-24 DIAGNOSIS — Z20822 Contact with and (suspected) exposure to covid-19: Secondary | ICD-10-CM | POA: Insufficient documentation

## 2021-05-24 NOTE — Discharge Instructions (Signed)

## 2021-05-24 NOTE — ED Triage Notes (Signed)
Pt reports his son may have been exposed to [place] (he is also here in [place]) so he is here d/t possible exposure. Pt reports no s/s. Only here because he was possible exposed as him and son live in the same house.  Only needs COVID test

## 2021-05-25 LAB — SARS CORONAVIRUS 2 (TAT 6-24 HRS): SARS Coronavirus 2: NEGATIVE

## 2021-06-03 DIAGNOSIS — M79644 Pain in right finger(s): Secondary | ICD-10-CM | POA: Diagnosis not present

## 2021-07-18 ENCOUNTER — Ambulatory Visit (INDEPENDENT_AMBULATORY_CARE_PROVIDER_SITE_OTHER): Payer: BC Managed Care – PPO | Admitting: Family Medicine

## 2021-07-18 ENCOUNTER — Other Ambulatory Visit: Payer: Self-pay

## 2021-07-18 ENCOUNTER — Encounter: Payer: Self-pay | Admitting: Family Medicine

## 2021-07-18 VITALS — BP 164/106 | HR 84 | Ht 70.0 in | Wt 209.0 lb

## 2021-07-18 DIAGNOSIS — M79644 Pain in right finger(s): Secondary | ICD-10-CM | POA: Diagnosis not present

## 2021-07-18 DIAGNOSIS — G8929 Other chronic pain: Secondary | ICD-10-CM | POA: Insufficient documentation

## 2021-07-18 DIAGNOSIS — Z7689 Persons encountering health services in other specified circumstances: Secondary | ICD-10-CM | POA: Diagnosis not present

## 2021-07-18 DIAGNOSIS — S6991XA Unspecified injury of right wrist, hand and finger(s), initial encounter: Secondary | ICD-10-CM

## 2021-07-18 DIAGNOSIS — R1319 Other dysphagia: Secondary | ICD-10-CM

## 2021-07-18 DIAGNOSIS — S63609A Unspecified sprain of unspecified thumb, initial encounter: Secondary | ICD-10-CM | POA: Diagnosis not present

## 2021-07-18 DIAGNOSIS — K222 Esophageal obstruction: Secondary | ICD-10-CM

## 2021-07-18 DIAGNOSIS — I10 Essential (primary) hypertension: Secondary | ICD-10-CM

## 2021-07-18 DIAGNOSIS — K219 Gastro-esophageal reflux disease without esophagitis: Secondary | ICD-10-CM

## 2021-07-18 MED ORDER — LISINOPRIL 20 MG PO TABS: 20.0000 mg | ORAL_TABLET | Freq: Every day | ORAL | 3 refills | Status: DC

## 2021-07-18 MED ORDER — AMLODIPINE BESYLATE 10 MG PO TABS: 10.0000 mg | ORAL_TABLET | Freq: Every day | ORAL | 3 refills | Status: DC

## 2021-07-18 MED ORDER — PANTOPRAZOLE SODIUM 40 MG PO TBEC: 40.0000 mg | DELAYED_RELEASE_TABLET | Freq: Every day | ORAL | 3 refills | Status: DC

## 2021-07-18 NOTE — Progress Notes (Signed)
Subjective:    Patient ID: Robert Suarez, male    DOB: [birth year], 55 y.o.   MRN: [id number]  Robert Suarez is a 55 y.o. male presenting on 07/18/2021 for Establish Care and Hand Pain  Here to establish care.  HPI  Esophageal Dysphagia Swallowing / Choking History >8-10 years with  Question allergy Last seen by Dr Allen Norris 10/2019 for EGD and consultation. Remains off PPI now, believes it was helping. Has not returned to GI.  Right Hand Pain Prior surgery on R Hand 7 years ago (Dr Mack Guise in 2015) question with ligament injury, he had prior fracture of hand and ligament injury. Now recently past 2 months, he had an injury at work with lifting injury, he did not go through [employer] and was seen as patient, he went to Dr Mack Guise again and limited options, was advised could do fusion but lose mobility, he is R handed  CHRONIC HTN: Reports elevated BP inadequate control, not checking regularly now. Current Meds - Amlodipine 5mg  daily, Lisinopril 10mg  BID   Reports good compliance, took meds today. Tolerating well, w/o complaints. Denies CP, dyspnea, HA, edema, dizziness / lightheadedness    Depression screen [facility] 2/9 07/18/2021  Decreased Interest 0  Down, Depressed, Hopeless 0  PHQ - 2 Score 0  Altered sleeping 0  Tired, decreased energy 0  Change in appetite 0  Feeling bad or failure about yourself  0  Trouble concentrating 0  Moving slowly or fidgety/restless 0  Suicidal thoughts 0  PHQ-9 Score 0  Difficult doing work/chores Not difficult at all    Past Medical History:  Diagnosis Date   Arthritis    right knee   Family history of adverse reaction to anesthesia    son dx'd with malignant hyperthermia 3 yrs ago (after jaw surgery)   GERD (gastroesophageal reflux disease)    Hypertension    Past Surgical History:  Procedure Laterality Date   COLONOSCOPY WITH PROPOFOL N/A 07/30/2016   Procedure: COLONOSCOPY WITH PROPOFOL;  Surgeon: Jonathon Bellows, MD;  Location: [facility]  ENDOSCOPY;  Service: Endoscopy;  Laterality: N/A;   ESOPHAGOGASTRODUODENOSCOPY (EGD) WITH PROPOFOL N/A 10/23/2019   Procedure: ESOPHAGOGASTRODUODENOSCOPY (EGD) WITH PROPOFOL;  Surgeon: Lucilla Lame, MD;  Location: [facility] ENDOSCOPY;  Service: Endoscopy;  Laterality: N/A;   ligament repair Right    NO PAST SURGERIES     Social History   Socioeconomic History   Marital status: Married    Spouse name: Not on file   Number of children: Not on file   Years of education: Not on file   Highest education level: Not on file  Occupational History   Not on file  Tobacco Use   Smoking status: Never   Smokeless tobacco: Never  Vaping Use   Vaping Use: Never used  Substance and Sexual Activity   Alcohol use: No   Drug use: No   Sexual activity: Yes  Other Topics Concern   Not on file  Social History Narrative   No history of violence/abuse   Social Determinants of Health   Financial Resource Strain: Not on file  Food Insecurity: Not on file  Transportation Needs: Not on file  Physical Activity: Not on file  Stress: Not on file  Social Connections: Not on file  Intimate Partner Violence: Not on file   Family History  Problem Relation Age of Onset   Healthy Mother    Healthy Father    Current Outpatient Medications on File Prior to Visit  Medication Sig   [  DISCONTINUED] fluticasone (FLONASE) 50 MCG/ACT nasal spray Place 2 sprays into both nostrils daily.   No current facility-administered medications on file prior to visit.    Review of Systems Per HPI unless specifically indicated above      Objective:    BP (!) 164/106   Pulse 84   Ht 5\' 10"  (1.778 m)   Wt 209 lb (94.8 kg)   SpO2 97%   BMI 29.99 kg/m   Wt Readings from Last 3 Encounters:  07/18/21 209 lb (94.8 kg)  05/24/21 200 lb (90.7 kg)  05/21/21 200 lb (90.7 kg)    Physical Exam Vitals and nursing note reviewed.  Constitutional:      General: He is not in acute distress.    Appearance: He is  well-developed. He is not diaphoretic.     Comments: Well-appearing, comfortable, cooperative  HENT:     Head: Normocephalic and atraumatic.  Eyes:     General:        Right eye: No discharge.        Left eye: No discharge.     Conjunctiva/sclera: Conjunctivae normal.  Neck:     Thyroid: No thyromegaly.  Cardiovascular:     Rate and Rhythm: Normal rate and regular rhythm.     Pulses: Normal pulses.     Heart sounds: Normal heart sounds. No murmur heard. Pulmonary:     Effort: Pulmonary effort is normal. No respiratory distress.     Breath sounds: Normal breath sounds. No wheezing or rales.  Musculoskeletal:        General: Normal range of motion.     Cervical back: Normal range of motion and neck supple.  Lymphadenopathy:     Cervical: No cervical adenopathy.  Skin:    General: Skin is warm and dry.     Findings: No erythema or rash.  Neurological:     Mental Status: He is alert and oriented to person, place, and time. Mental status is at baseline.  Psychiatric:        Behavior: Behavior normal.     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   Relevant Orders   Ambulatory referral to Orthopedic Surgery   Other Visit Diagnoses     Injury of right thumb, initial encounter       Relevant Orders   Ambulatory referral to Orthopedic Surgery   Complete tear of ligament of thumb       Relevant Orders    Ambulatory referral to Orthopedic Surgery   Encounter to establish care with new doctor       Gastroesophageal reflux disease without esophagitis       Relevant Medications   pantoprazole (PROTONIX) 40 MG tablet   Benign esophageal stricture       Relevant Medications   pantoprazole (PROTONIX) 40 MG tablet       #HTN Uncontrolled Inadequate therapy  Dose inc Amlodipine, Stop 5mg  and Start Amlodipine 10mg  daily new rx Adjust Lisinopril from 10 BID to Start Lisinopril 20mg  once daily new rx  #esophageal stricture / GERD Previously followed by [facility] Dr Allen Norris, s/p EGD and diltation.  Patient off PPI, will re order 40mg  daily Advised him to contact [place] office to follow=up since problem has returned. Difficulty with swallowing still with dysphagia  #R Thumb ligamentous injury Referral to [facility] for evaluation and 2nd opinion on management of Right Thumb LIgament re-injury, prior tear of ligament back in 2015 7 yrs ago had repaired by local orthopedic surgery, now new injury 2 months ago with re-injured torn ligament of R thumb, he is right handed, previous orthopedic surgeon recommended a thumb fusion that would lose his range of movement, he would like other opinion consultation on his Right thumb ligament injury   Orders Placed This Encounter  Procedures   Ambulatory referral to Orthopedic Surgery    Referral Priority:   Routine    Referral Type:   Surgical    Referral Reason:   Specialty Services Required    Requested Specialty:   Orthopedic Surgery    Number of Visits Requested:   1      Meds ordered this encounter  Medications   pantoprazole (PROTONIX) 40 MG tablet    Sig: Take 1 tablet (40 mg total) by mouth daily before breakfast.    Dispense:  90 tablet    Refill:  3   lisinopril (ZESTRIL) 20 MG tablet    Sig: Take 1 tablet (20 mg total) by mouth daily.    Dispense:  90 tablet    Refill:  3    Instead of 10 BID will change to 20 Daily   amLODipine (NORVASC)  10 MG tablet    Sig: Take 1 tablet (10 mg total) by mouth daily.    Dispense:  90 tablet    Refill:  3    Dose change from 5 to 10    Follow up plan: Return in about 6 weeks (around 08/29/2021) for 6 week follow-up HTN med adjust, Updates Ortho/GI.  Nobie Putnam, DO [facility] 07/18/2021, 10:26 AM

## 2021-07-18 NOTE — Patient Instructions (Addendum)
Thank you for coming to the office today.  Referral was sent to [facility].  [facility] [address] [place], [place] [address] Phone: [phone]  -------------------------  Last done with EGD Endoscopy in 10/2019, they dilated the junction at your stomach and esophagus. Please call them back to check what else can be done and follow up with Dr Lucilla Lame. They may have other recommendations for you. This is not something that I treat often.  [facility]) [address] [place], [place] [address] Main: [phone] -----------------------------------------  Switched BP medications today  NEW rx  Start Amlodipine 10mg  daily Start Lisinopril 20mg  once daily  Stop the previous ones.   Please schedule a Follow-up Appointment to: Return in about 6 weeks (around 08/29/2021) for 6 week follow-up HTN med adjust, Updates Ortho/GI.  If you have any other questions or concerns, please feel free to call the office or send a message through [facility]. You may also schedule an earlier appointment if necessary.  Additionally, you may be receiving a survey about your experience at our office within a few days to 1 week by e-mail or mail. We value your feedback.  Nobie Putnam, DO [facility]

## 2021-08-12 DIAGNOSIS — M19041 Primary osteoarthritis, right hand: Secondary | ICD-10-CM | POA: Diagnosis not present

## 2021-08-12 DIAGNOSIS — S6991XA Unspecified injury of right wrist, hand and finger(s), initial encounter: Secondary | ICD-10-CM | POA: Diagnosis not present

## 2021-08-12 DIAGNOSIS — W228XXA Striking against or struck by other objects, initial encounter: Secondary | ICD-10-CM | POA: Diagnosis not present

## 2021-08-12 DIAGNOSIS — M25341 Other instability, right hand: Secondary | ICD-10-CM | POA: Diagnosis not present

## 2021-08-13 IMAGING — CR DG CHEST 2V
1 series · 2 of 2 positions shown · non-contrast
Comparison: No prior.

CLINICAL DATA: Suspected sepsis.

EXAM:
CHEST - 2 VIEW

[Series 1: w chest pa · 0.14mm/px · 2 of 2 slices shown]
[im 1/2]
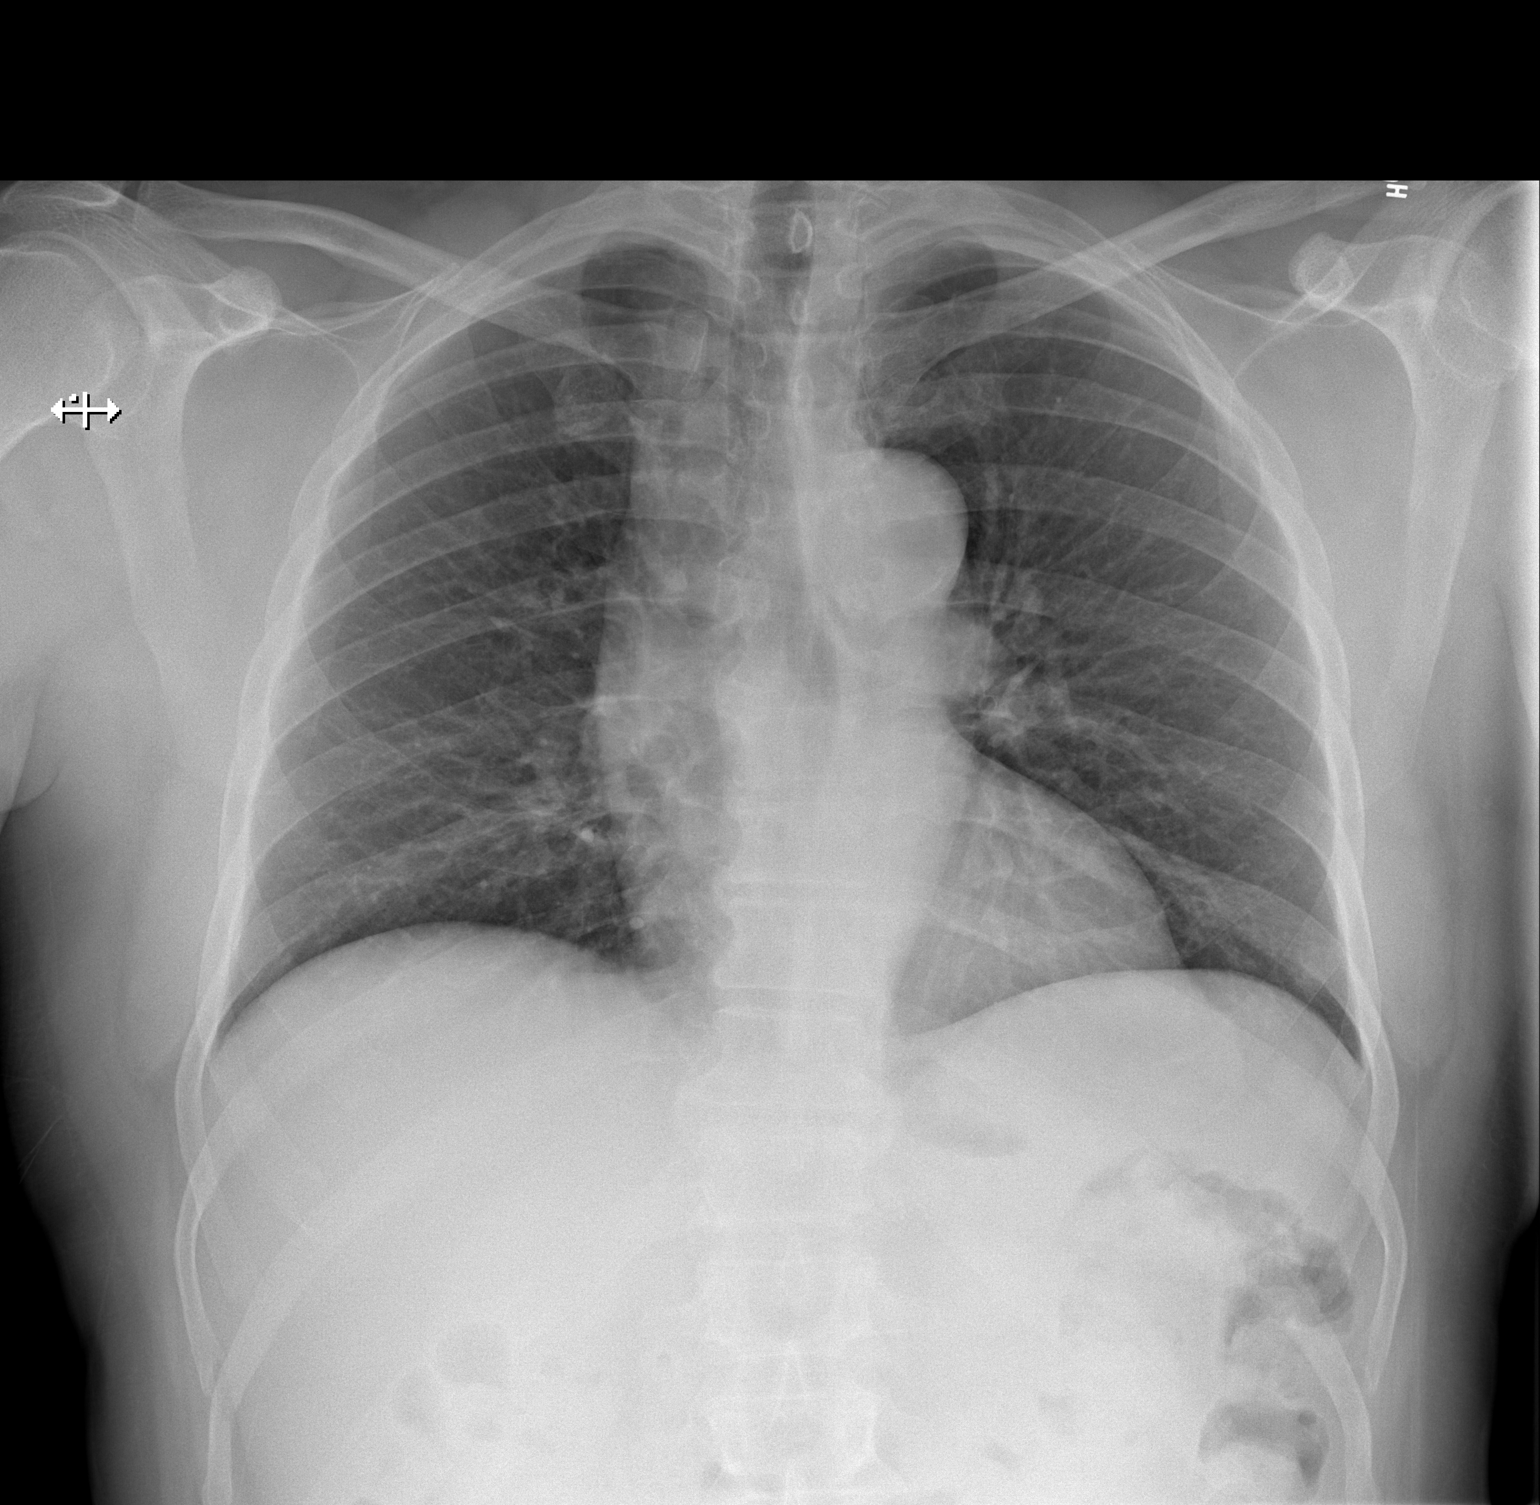
[im 2/2]
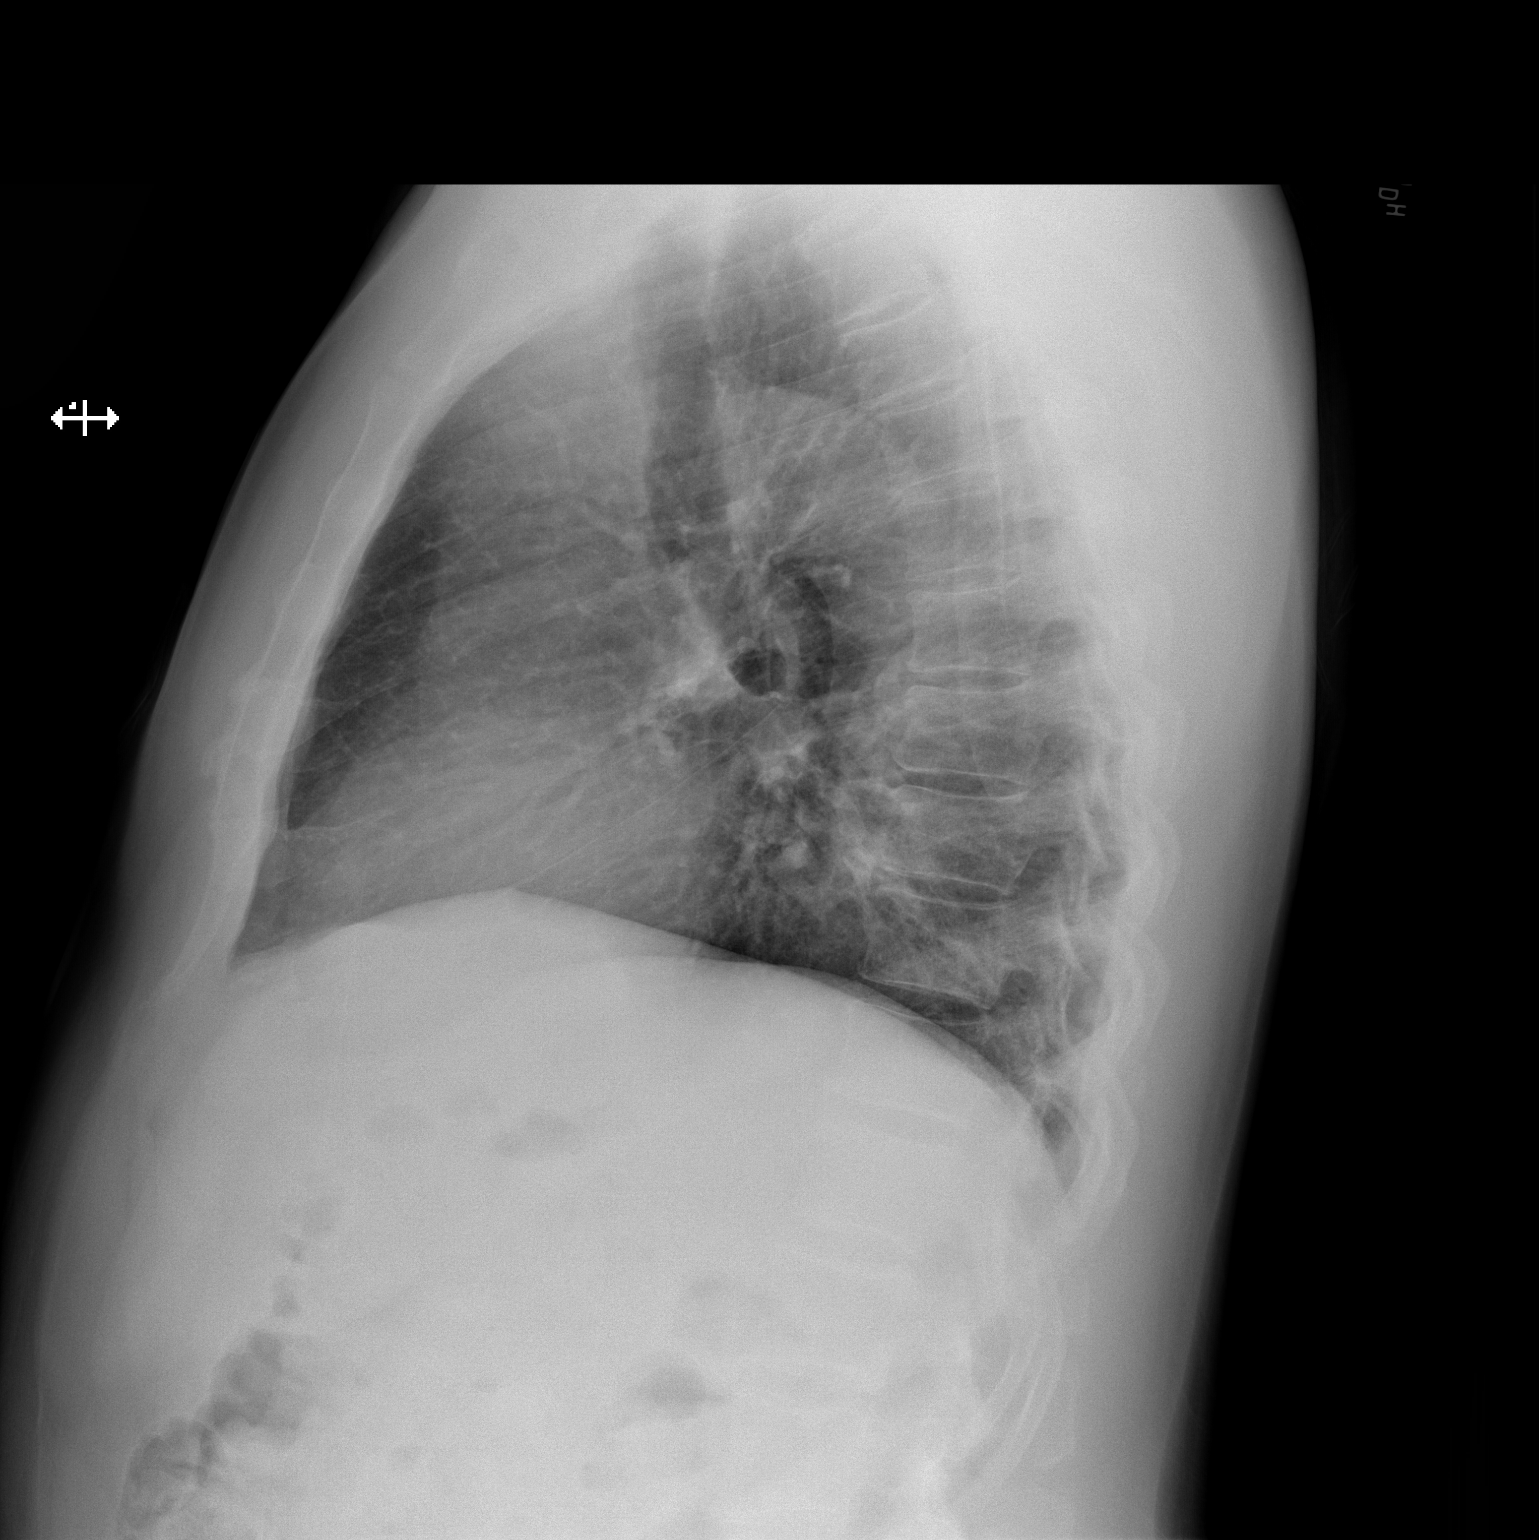

[2 of 2 positions shown; findings below may reference images not displayed]

FINDINGS: Mediastinum and hilar structures normal. Low lung volumes. No focal
infiltrate. No pleural effusion or pneumothorax. No pleural effusion
or pneumothorax.
IMPRESSION: No acute cardiopulmonary disease.

## 2021-08-14 IMAGING — CT CT RENAL STONE PROTOCOL
2 of 4 series · 15 of 46 positions shown, 17 images · non-contrast
Comparison: 05/29/2009

CLINICAL DATA: Flank pain

EXAM:
CT ABDOMEN AND PELVIS WITHOUT CONTRAST
TECHNIQUE: Multidetector CT imaging of the abdomen and pelvis was performed
following the standard protocol without IV contrast.

[Series 2: stone full standard · axial · 0.76mm/px · z∈[-956,-531]mm · 12 of 97 slices shown, 14 images]
[im 8/97  soft-tissue]
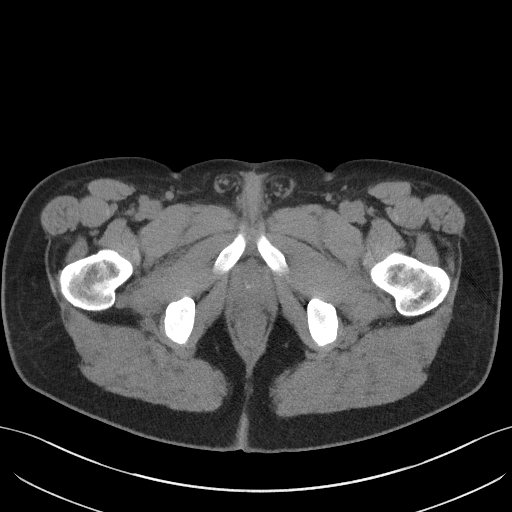
[im 8/97  bone]
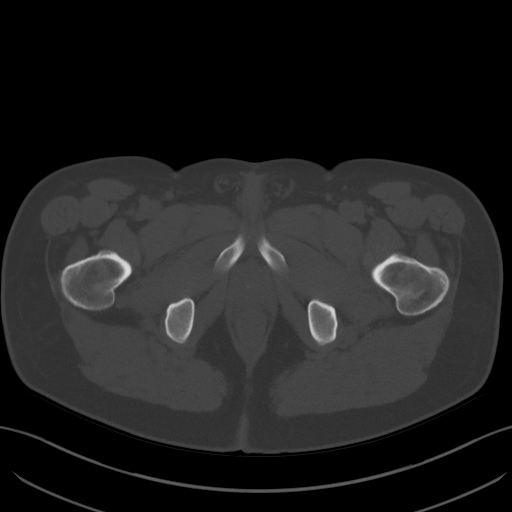
[im 16/97  soft-tissue]
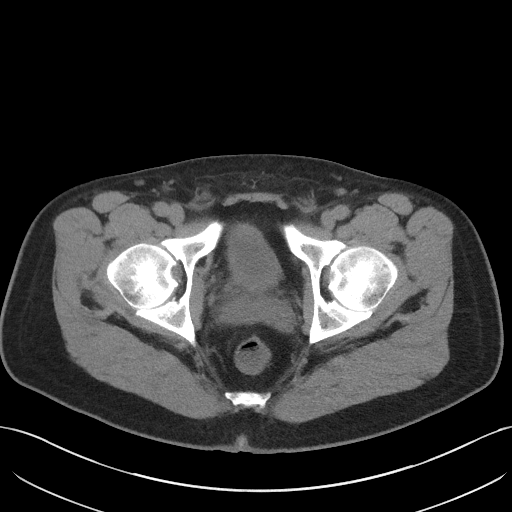
[im 24/97  soft-tissue]
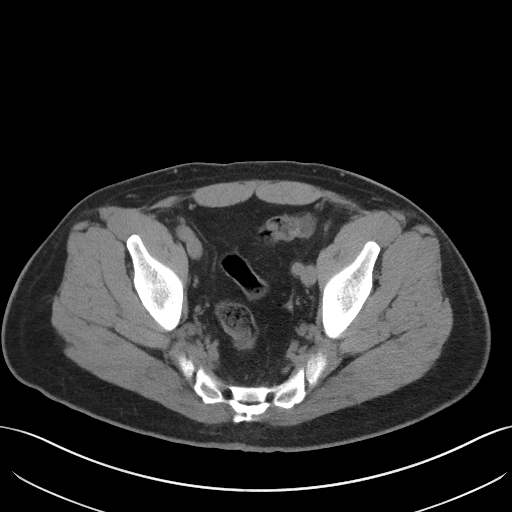
[im 31/97  soft-tissue]
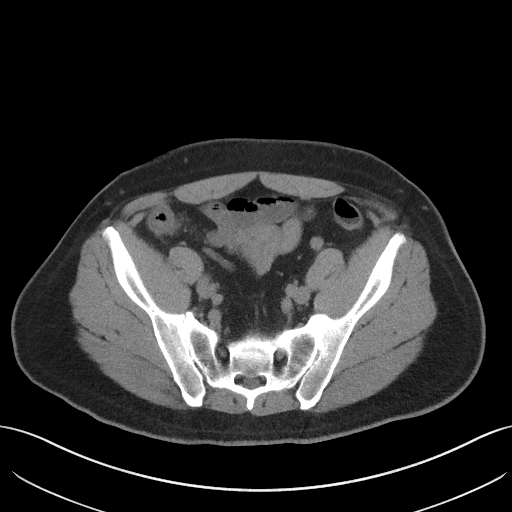
[im 39/97  soft-tissue]
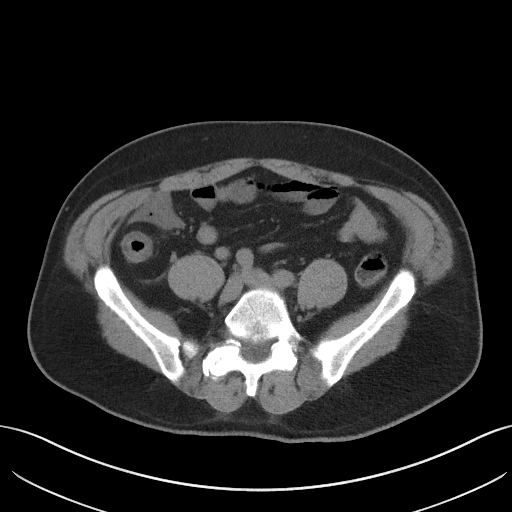
[im 47/97  soft-tissue]
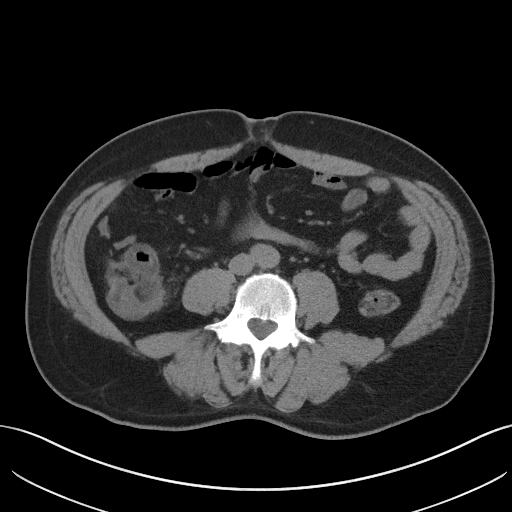
[im 54/97  soft-tissue]
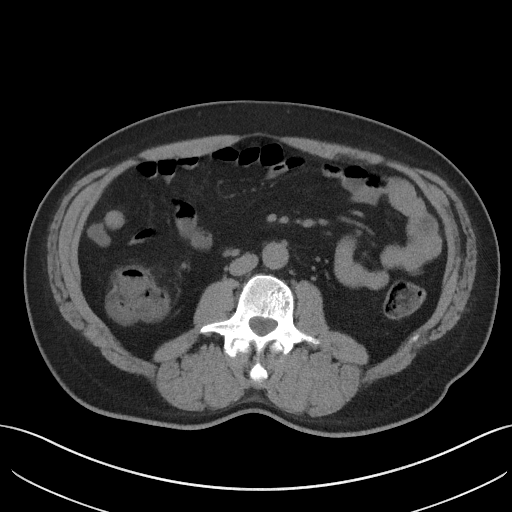
[im 62/97  soft-tissue]
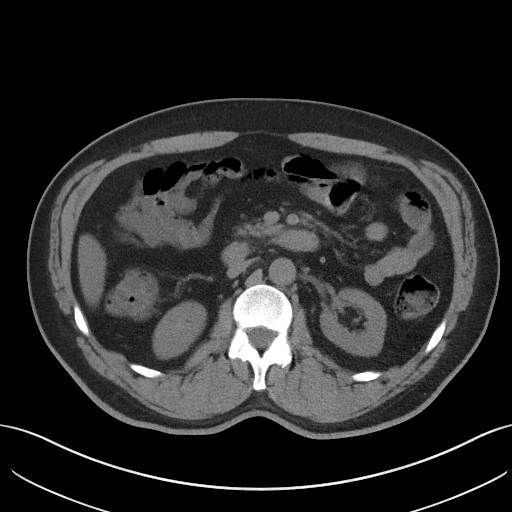
[im 70/97  soft-tissue]
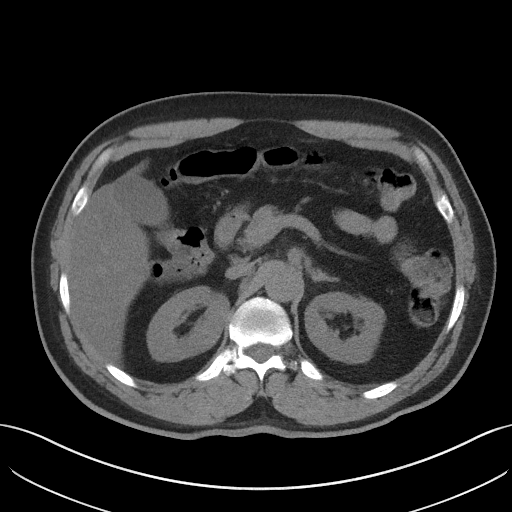
[im 70/97  bone]
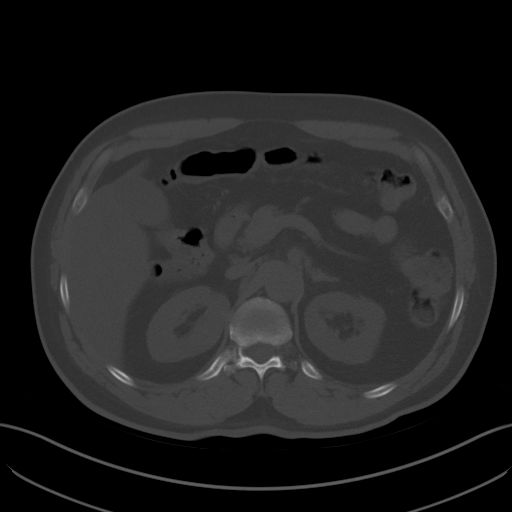
[im 77/97  soft-tissue]
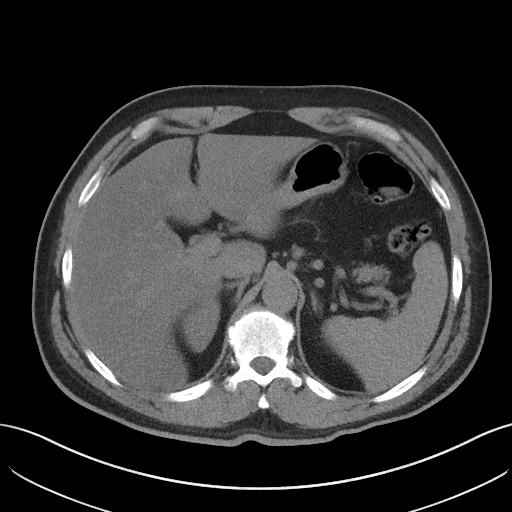
[im 85/97  soft-tissue]
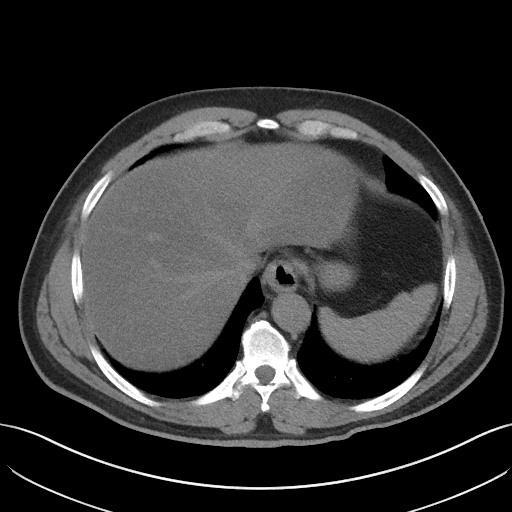
[im 93/97  soft-tissue]
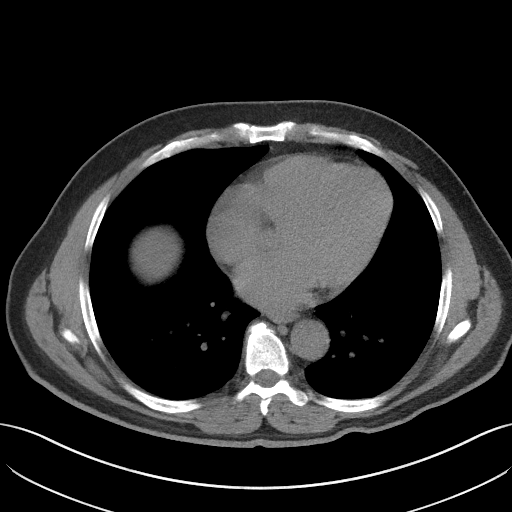

[Series 5: coronal · coronal · 0.77mm/px · 3 of 123 slices shown]
[im 41/123  soft-tissue]
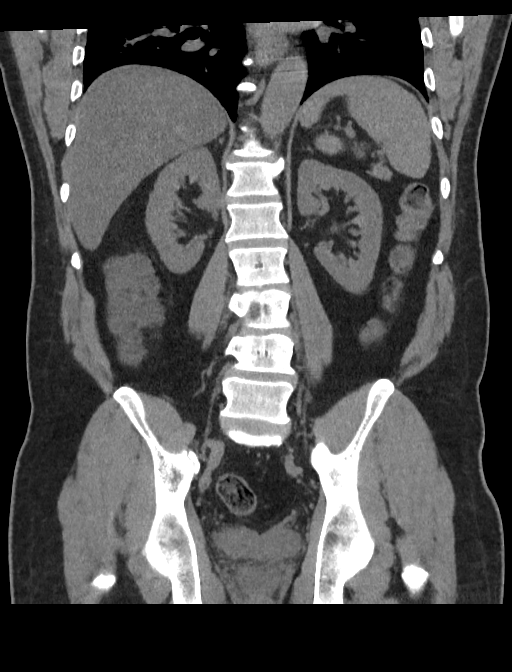
[im 55/123  soft-tissue]
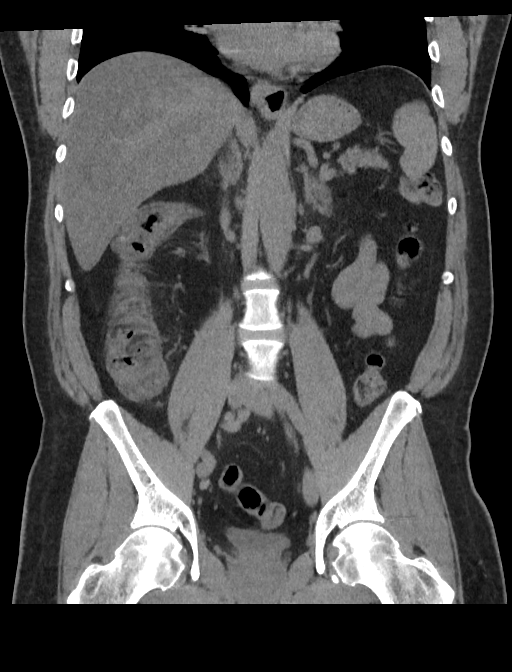
[im 68/123  soft-tissue]
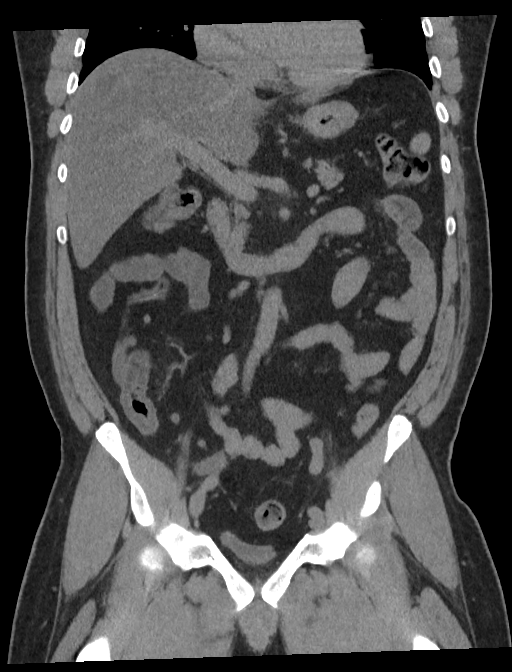

[15 of 46 positions shown; findings below may reference images not displayed]

FINDINGS: Lower chest: No acute abnormality.

Hepatobiliary: Diffuse fatty infiltration of the liver is noted.
There is a geographic 2.6 cm hypodense lesion in the dome of the
right lobe of the liver which likely represents a cyst or
hemangioma. No other hepatic mass is noted. The gallbladder is
within normal limits.

Pancreas: Unremarkable. No pancreatic ductal dilatation or
surrounding inflammatory changes.

Spleen: Normal in size without focal abnormality.

Adrenals/Urinary Tract: Adrenal glands are within normal limits.
Kidneys are well visualized bilaterally. No renal calculi or
obstructive changes are noted. The ureters are within normal limits.
Bladder is decompressed.

Stomach/Bowel: The appendix is within normal limits. The colon is
within normal limits with the exception of the cecum which
demonstrates some mild inflammatory change. There is wall thickening
within the terminal ileum with some mucosal hyperemia. These changes
along with changes in the cecum may simply represent infectious
enteritis. The possibility of underlying inflammatory bowel disease
deserves consideration however. Further workup can be performed as
clinically indicated. More proximal small bowel and stomach are
within normal limits.

Vascular/Lymphatic: No significant vascular findings are present. No
enlarged abdominal or pelvic lymph nodes.

Reproductive: Prostate is unremarkable.

Other: No abdominal wall hernia or abnormality. No abdominopelvic
ascites.

Musculoskeletal: Bony structures show degenerative change of the
lumbar spine.
IMPRESSION: Changes in the terminal ileum and cecum consistent with enteritis.
The etiology is uncertain but inflammatory bowel disease deserves
consideration. Further workup is recommended.

Fatty infiltration.

2.6 cm hypodense lesion in the dome of the right lobe of the liver.
This may simply represent a cyst although hemangiomas the deserves
consideration as well. This is larger than that seen on prior
examination from 2787. Nonemergent ultrasound may be helpful for
further evaluation.

## 2021-08-29 ENCOUNTER — Encounter: Payer: Self-pay | Admitting: Family Medicine

## 2021-08-29 ENCOUNTER — Other Ambulatory Visit: Payer: Self-pay

## 2021-08-29 ENCOUNTER — Ambulatory Visit (INDEPENDENT_AMBULATORY_CARE_PROVIDER_SITE_OTHER): Payer: BC Managed Care – PPO | Admitting: Family Medicine

## 2021-08-29 VITALS — BP 154/102 | HR 80 | Ht 70.0 in | Wt 208.2 lb

## 2021-08-29 DIAGNOSIS — R7309 Other abnormal glucose: Secondary | ICD-10-CM | POA: Diagnosis not present

## 2021-08-29 DIAGNOSIS — I10 Essential (primary) hypertension: Secondary | ICD-10-CM

## 2021-08-29 DIAGNOSIS — Z125 Encounter for screening for malignant neoplasm of prostate: Secondary | ICD-10-CM | POA: Diagnosis not present

## 2021-08-29 DIAGNOSIS — S63641D Sprain of metacarpophalangeal joint of right thumb, subsequent encounter: Secondary | ICD-10-CM

## 2021-08-29 DIAGNOSIS — N529 Male erectile dysfunction, unspecified: Secondary | ICD-10-CM | POA: Diagnosis not present

## 2021-08-29 MED ORDER — SILDENAFIL CITRATE 20 MG PO TABS: ORAL_TABLET | ORAL | 3 refills | Status: DC

## 2021-08-29 MED ORDER — OLMESARTAN MEDOXOMIL 40 MG PO TABS: 40.0000 mg | ORAL_TABLET | Freq: Every day | ORAL | 3 refills | Status: DC

## 2021-08-29 NOTE — Patient Instructions (Addendum)
Thank you for coming to the office today.  Stop Lisinopril 20mg . START Olmesartan 40mg  daily instead - same class of med.  Keep on Amlodipine 10mg  daily  Start Sildenafil 20mg  - take 1 tablet about 30 min prior to sexual intercourse for improved erection. If this dose does not work or is not strong enough NEXT TIME you can increase to 2 pills for 40mg . Maximum dose is 5 pills or 100mg , most people end up taking 3-4 pills per dose and this decision is up to you based on the results.  Once you take a dose, you have to wait 24 hours to repeat a dose.  You will need to use www.goodrx.com website or app on phone to enter "Sildenafil" 20mg  medication and "Get Free Coupon" option to save for [facility] once you select pharmacy and # of pills. Show that coupon to pharmacy. Otherwise it will cost hundreds of dollars.  Follow up if not working or new concerns we can refer you to a Urologist.    START anti inflammatory topical - OTC Voltaren (generic Diclofenac) topical 2-4 times a day as needed for pain swelling of affected joint for 1-2 weeks or longer.  DASH Eating Plan DASH stands for Dietary Approaches to Stop Hypertension. The DASH eating plan is a healthy eating plan that has been shown to: Reduce high blood pressure (hypertension). Reduce your risk for type 2 diabetes, heart disease, and stroke. Help with weight loss. What are tips for following this plan? Reading food labels Check food labels for the amount of salt (sodium) per serving. Choose foods with less than 5 percent of the Daily Value of sodium. Generally, foods with less than 300 milligrams (mg) of sodium per serving fit into this eating plan. To find whole grains, look for the word "whole" as the first word in the ingredient list. Shopping Buy products labeled as "low-sodium" or "no salt added." Buy fresh foods. Avoid canned foods and pre-made or frozen meals. Cooking Avoid adding salt when cooking. Use salt-free seasonings  or herbs instead of table salt or sea salt. Check with your health care provider or pharmacist before using salt substitutes. Do not fry foods. Cook foods using healthy methods such as baking, boiling, grilling, roasting, and broiling instead. Cook with heart-healthy oils, such as olive, canola, avocado, soybean, or sunflower oil. Meal planning  Eat a balanced diet that includes: 4 or more servings of fruits and 4 or more servings of vegetables each day. Try to fill one-half of your plate with fruits and vegetables. 6-8 servings of whole grains each day. Less than 6 oz (170 g) of lean meat, poultry, or fish each day. A 3-oz (85-g) serving of meat is about the same size as a deck of cards. One egg equals 1 oz (28 g). 2-3 servings of low-fat dairy each day. One serving is 1 cup (237 mL). 1 serving of nuts, seeds, or beans 5 times each week. 2-3 servings of heart-healthy fats. Healthy fats called omega-3 fatty acids are found in foods such as walnuts, flaxseeds, fortified milks, and eggs. These fats are also found in cold-water fish, such as sardines, salmon, and mackerel. Limit how much you eat of: Canned or prepackaged foods. Food that is high in trans fat, such as some fried foods. Food that is high in saturated fat, such as fatty meat. Desserts and other sweets, sugary drinks, and other foods with added sugar. Full-fat dairy products. Do not salt foods before eating. Do not eat more than  4 egg yolks a week. Try to eat at least 2 vegetarian meals a week. Eat more home-cooked food and less restaurant, buffet, and fast food. Lifestyle When eating at a restaurant, ask that your food be prepared with less salt or no salt, if possible. If you drink alcohol: Limit how much you use to: 0-1 drink a day for women who are not pregnant. 0-2 drinks a day for men. Be aware of how much alcohol is in your drink. In the [place] one drink equals one 12 oz bottle of beer (355 mL), one 5 oz glass of wine (148  mL), or one 1 oz glass of hard liquor (44 mL). General information Avoid eating more than 2,300 mg of salt a day. If you have hypertension, you may need to reduce your sodium intake to 1,500 mg a day. Work with your health care provider to maintain a healthy body weight or to lose weight. Ask what an ideal weight is for you. Get at least 30 minutes of exercise that causes your heart to beat faster (aerobic exercise) most days of the week. Activities may include walking, swimming, or biking. Work with your health care provider or dietitian to adjust your eating plan to your individual calorie needs. What foods should I eat? Fruits All fresh, dried, or frozen fruit. Canned fruit in natural juice (without added sugar). Vegetables Fresh or frozen vegetables (raw, steamed, roasted, or grilled). Low-sodium or reduced-sodium tomato and vegetable juice. Low-sodium or reduced-sodium tomato sauce and tomato paste. Low-sodium or reduced-sodium canned vegetables. Grains Whole-grain or whole-wheat bread. Whole-grain or whole-wheat pasta. Brown rice. [place]. Bulgur. Whole-grain and low-sodium cereals. Pita bread. Low-fat, low-sodium crackers. Whole-wheat flour tortillas. Meats and other proteins Skinless chicken or [place]. Ground chicken or [place]. Pork with fat trimmed off. Fish and seafood. Egg whites. Dried beans, peas, or lentils. Unsalted nuts, nut butters, and seeds. Unsalted canned beans. Lean cuts of beef with fat trimmed off. Low-sodium, lean precooked or cured meat, such as sausages or meat loaves. Dairy Low-fat (1%) or fat-free (skim) milk. Reduced-fat, low-fat, or fat-free cheeses. Nonfat, low-sodium ricotta or cottage cheese. Low-fat or nonfat yogurt. Low-fat, low-sodium cheese. Fats and oils Soft margarine without trans fats. Vegetable oil. Reduced-fat, low-fat, or light mayonnaise and salad dressings (reduced-sodium). Canola, safflower, olive, avocado, soybean, and sunflower oils.  Avocado. Seasonings and condiments Herbs. Spices. Seasoning mixes without salt. Other foods Unsalted popcorn and pretzels. Fat-free sweets. The items listed above may not be a complete list of foods and beverages you can eat. Contact a dietitian for more information. What foods should I avoid? Fruits Canned fruit in a light or heavy syrup. Fried fruit. Fruit in cream or butter sauce. Vegetables Creamed or fried vegetables. Vegetables in a cheese sauce. Regular canned vegetables (not low-sodium or reduced-sodium). Regular canned tomato sauce and paste (not low-sodium or reduced-sodium). Regular tomato and vegetable juice (not low-sodium or reduced-sodium). Angie Fava. Olives. Grains Baked goods made with fat, such as croissants, muffins, or some breads. Dry pasta or rice meal packs. Meats and other proteins Fatty cuts of meat. Ribs. Fried meat. Berniece Salines. Bologna, salami, and other precooked or cured meats, such as sausages or meat loaves. Fat from the back of a pig (fatback). Bratwurst. Salted nuts and seeds. Canned beans with added salt. Canned or smoked fish. Whole eggs or egg yolks. Chicken or [place] with skin. Dairy Whole or 2% milk, cream, and half-and-half. Whole or full-fat cream cheese. Whole-fat or sweetened yogurt. Full-fat cheese. Nondairy creamers. Whipped toppings.  Processed cheese and cheese spreads. Fats and oils Butter. Stick margarine. Lard. Shortening. Ghee. Bacon fat. Tropical oils, such as coconut, palm kernel, or palm oil. Seasonings and condiments Onion salt, garlic salt, seasoned salt, table salt, and sea salt. Worcestershire sauce. Tartar sauce. Barbecue sauce. Teriyaki sauce. Soy sauce, including reduced-sodium. Steak sauce. Canned and packaged gravies. Fish sauce. Oyster sauce. Cocktail sauce. Store-bought horseradish. Ketchup. Mustard. Meat flavorings and tenderizers. Bouillon cubes. Hot sauces. Pre-made or packaged marinades. Pre-made or packaged taco seasonings. Relishes.  Regular salad dressings. Other foods Salted popcorn and pretzels. The items listed above may not be a complete list of foods and beverages you should avoid. Contact a dietitian for more information. Where to find more information National Heart, Lung, and Blood Institute: https://wilson-eaton.com/ American Heart Association: www.heart.org Academy of Nutrition and Dietetics: www.eatright.[place]: www.kidney.org Summary The DASH eating plan is a healthy eating plan that has been shown to reduce high blood pressure (hypertension). It may also reduce your risk for type 2 diabetes, heart disease, and stroke. When on the DASH eating plan, aim to eat more fresh fruits and vegetables, whole grains, lean proteins, low-fat dairy, and heart-healthy fats. With the DASH eating plan, you should limit salt (sodium) intake to 2,300 mg a day. If you have hypertension, you may need to reduce your sodium intake to 1,500 mg a day. Work with your health care provider or dietitian to adjust your eating plan to your individual calorie needs. This information is not intended to replace advice given to you by your health care provider. Make sure you discuss any questions you have with your health care provider. Document Revised: 08/04/2019 Document Reviewed: 08/04/2019 Elsevier Patient Education  2022 [place].   Please schedule a Follow-up Appointment to: Return in about 3 months (around 11/27/2021) for 3 month follow-up HTN.  If you have any other questions or concerns, please feel free to call the office or send a message through [place]. You may also schedule an earlier appointment if necessary.  Additionally, you may be receiving a survey about your experience at our office within a few days to 1 week by e-mail or mail. We value your feedback.  Nobie Putnam, DO [place]

## 2021-08-29 NOTE — Progress Notes (Signed)
Subjective:    Patient ID: Robert Suarez, male    DOB: [birth year], 55 y.o.   MRN: [id number]  Robert Suarez is a 55 y.o. male presenting on 08/29/2021 for Hypertension   HPI  CHRONIC HTN: Reports elevated BP inadequate control Home BP readings 160/108 Works at night, took meds 3am today Current Meds - Amlodipine 10mg  daily, Lisinopril 20mg  Reports good compliance, took meds today. Tolerating well, w/o complaints. 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  Esophageal Dysphagia Swallowing / Choking Chronic problem >10 years Last seen by Dr Allen Norris 10/2019 for EGD and consultation. On PPI Pantoprazole 40mg  daily  Left Toe Pain, episodic Great toe has episodic pain, worse with steel toed boots, says he has flat feet and not using the costly inserts anymore, seems boot rubs against toe some. No other problem or injury   Depression screen [facility] 2/9 07/18/2021  Decreased Interest 0  Down, Depressed, Hopeless 0  PHQ - 2 Score 0  Altered sleeping 0  Tired, decreased energy 0  Change in appetite 0  Feeling bad or failure about yourself  0  Trouble concentrating 0  Moving slowly or fidgety/restless 0  Suicidal thoughts 0  PHQ-9 Score 0  Difficult doing work/chores Not difficult at all    Social History   Tobacco Use   Smoking status: Never   Smokeless tobacco: Never  Vaping Use   Vaping Use: Never used  Substance Use Topics   Alcohol use: No   Drug use: No    Review of Systems Per HPI unless specifically indicated above     Objective:    BP (!) 154/102    Pulse  80    Ht 5\' 10"  (1.778 m)    Wt 208 lb 3.2 oz (94.4 kg)    SpO2 100%    BMI 29.87 kg/m   Wt Readings from Last 3 Encounters:  08/29/21 208 lb 3.2 oz (94.4 kg)  07/18/21 209 lb (94.8 kg)  05/24/21 200 lb (90.7 kg)    Physical Exam Vitals and nursing note reviewed.  Constitutional:      General: He is not in acute distress.    Appearance: He is well-developed. He is not diaphoretic.     Comments: Well-appearing, comfortable, cooperative  HENT:     Head: Normocephalic and atraumatic.  Eyes:     General:        Right eye: No discharge.        Left eye: No discharge.     Conjunctiva/sclera: Conjunctivae normal.  Neck:     Thyroid: No thyromegaly.  Cardiovascular:     Rate and Rhythm: Normal rate and regular rhythm.     Pulses: Normal pulses.     Heart sounds: Normal heart sounds. No murmur heard. Pulmonary:     Effort: Pulmonary effort is normal. No respiratory distress.     Breath sounds: Normal breath sounds. No wheezing or rales.  Musculoskeletal:        General: Normal range of motion.     Cervical back: Normal range of motion and neck supple.  Lymphadenopathy:     Cervical: No cervical adenopathy.  Skin:    General: Skin is warm and dry.     Findings: No erythema or rash.  Neurological:     Mental Status: He is alert and oriented to person, place, and time. Mental status is at baseline.  Psychiatric:        Behavior: Behavior normal.     Comments: Well groomed, good eye contact, normal speech and thoughts     Results for orders placed or performed during the hospital encounter of 05/24/21  SARS CORONAVIRUS 2 (TAT 6-24 HRS) Nasopharyngeal Nasopharyngeal Swab   Specimen: Nasopharyngeal Swab  Result Value Ref Range   SARS Coronavirus 2 NEGATIVE NEGATIVE      Assessment & Plan:   Problem List Items Addressed This Visit     Essential hypertension - Primary    Elevated BP still - Home BP readings Elevated  No known complications     Plan:  1. Change Lisinopril  20 to Olmesartan 40mg  ARB 2. Continue Amlodipine 10mg  daily 2. Encourage improved lifestyle - low sodium diet, regular exercise - emphasis on poor diet as cause of BP 3. Continue monitor BP outside office, bring readings to next visit, if persistently >140/90 or new symptoms notify office sooner 4. Follow-up 3 months - consider other secondary causes HTN vs add thiazide      Relevant Medications   sildenafil (REVATIO) 20 MG tablet   olmesartan (BENICAR) 40 MG tablet   Other Relevant Orders   COMPLETE METABOLIC PANEL WITH GFR   CBC with Differential/Platelet   Other Visit Diagnoses     Erectile dysfunction, unspecified erectile dysfunction type       Relevant Medications   sildenafil (REVATIO) 20 MG tablet   Rupture of ulnar collateral ligament of right thumb, subsequent encounter       Abnormal glucose       Relevant Orders   Hemoglobin A1c   Screening for prostate cancer       Relevant Orders   PSA       Labs ordered today, chemistry PSA A1c Lipids  ED Ordered Sildenafil PRN goodrx  R Thumb Followed by Workers comp / Orthopedics Has upcoming surgical repair scheduled at [facility] This Encounter  Procedures   COMPLETE METABOLIC PANEL WITH GFR   CBC with Differential/Platelet   Hemoglobin A1c   PSA     Meds ordered this encounter  Medications   sildenafil (REVATIO) 20 MG tablet    Sig: Take 1-5 pills about 30 min prior to sex. Start with 1 and increase as needed.    Dispense:  90 tablet    Refill:  3   olmesartan (BENICAR) 40 MG tablet    Sig: Take 1 tablet (40 mg total) by mouth daily.    Dispense:  90 tablet    Refill:  3    Discontinue Lisinopril      Follow up plan: Return in about 3 months (around 11/27/2021) for 3 month follow-up HTN.   Nobie Putnam, DO [facility] 08/29/2021, 9:30 AM

## 2021-08-29 NOTE — Assessment & Plan Note (Addendum)
Elevated BP still - Home BP readings Elevated  No known complications     Plan:  1. Change Lisinopril 20 to Olmesartan 40mg  ARB 2. Continue Amlodipine 10mg  daily 2. Encourage improved lifestyle - low sodium diet, regular exercise - emphasis on poor diet as cause of BP 3. Continue monitor BP outside office, bring readings to next visit, if persistently >140/90 or new symptoms notify office sooner 4. Follow-up 3 months - consider other secondary causes HTN vs add thiazide

## 2021-08-30 LAB — CBC WITH DIFFERENTIAL/PLATELET
Absolute Monocytes: 880 cells/uL (ref 200–950)
Basophils Absolute: 48 cells/uL (ref 0–200)
Basophils Relative: 0.6 %
Eosinophils Absolute: 128 cells/uL (ref 15–500)
Eosinophils Relative: 1.6 %
HCT: 45.2 % (ref 38.5–50.0)
Hemoglobin: 15.8 g/dL (ref 13.2–17.1)
Lymphs Abs: 1952 cells/uL (ref 850–3900)
MCH: 34.3 pg — ABNORMAL HIGH (ref 27.0–33.0)
MCHC: 35 g/dL (ref 32.0–36.0)
MCV: 98 fL (ref 80.0–100.0)
MPV: 11.6 fL (ref 7.5–12.5)
Monocytes Relative: 11 %
Neutro Abs: 4992 cells/uL (ref 1500–7800)
Neutrophils Relative %: 62.4 %
Platelets: 245 10*3/uL (ref 140–400)
RBC: 4.61 10*6/uL (ref 4.20–5.80)
RDW: 12.2 % (ref 11.0–15.0)
Total Lymphocyte: 24.4 %
WBC: 8 10*3/uL (ref 3.8–10.8)

## 2021-08-30 LAB — COMPLETE METABOLIC PANEL WITH GFR
AG Ratio: 2.2 (calc) (ref 1.0–2.5)
ALT: 28 U/L (ref 9–46)
AST: 20 U/L (ref 10–35)
Albumin: 4.4 g/dL (ref 3.6–5.1)
Alkaline phosphatase (APISO): 70 U/L (ref 35–144)
BUN: 10 mg/dL (ref 7–25)
CO2: 28 mmol/L (ref 20–32)
Calcium: 9.3 mg/dL (ref 8.6–10.3)
Chloride: 107 mmol/L (ref 98–110)
Creat: 0.98 mg/dL (ref 0.70–1.30)
Globulin: 2 g/dL (calc) (ref 1.9–3.7)
Glucose, Bld: 97 mg/dL (ref 65–99)
Potassium: 4.1 mmol/L (ref 3.5–5.3)
Sodium: 142 mmol/L (ref 135–146)
Total Bilirubin: 0.7 mg/dL (ref 0.2–1.2)
Total Protein: 6.4 g/dL (ref 6.1–8.1)
eGFR: 91 mL/min/{1.73_m2} (ref >=60)

## 2021-08-30 LAB — HEMOGLOBIN A1C
Hgb A1c MFr Bld: 5.4 % of total Hgb (ref <5.7)
Mean Plasma Glucose: 108 mg/dL
eAG (mmol/L): 6 mmol/L

## 2021-08-30 LAB — PSA: PSA: 1.91 ng/mL (ref <=4.00)

## 2021-09-09 DIAGNOSIS — Z20822 Contact with and (suspected) exposure to covid-19: Secondary | ICD-10-CM | POA: Diagnosis not present

## 2021-09-09 DIAGNOSIS — Z03818 Encounter for observation for suspected exposure to other biological agents ruled out: Secondary | ICD-10-CM | POA: Diagnosis not present

## 2021-09-11 ENCOUNTER — Ambulatory Visit: Payer: Self-pay | Admitting: *Deleted

## 2021-09-11 NOTE — Telephone Encounter (Signed)
Patient called, left VM to return the call to the office to discuss symptoms with a nurse.  Summary: cold headaches,nose stuffy and a cough.   Pt stated he has a bad cold headaches,nose stuffy and a cough.Pt stated he had COVID test done 2 days ago and was negative.  First appointment is for 09/16/2021 virtual/office.   Pt seeking clinical advice.

## 2021-09-11 NOTE — Telephone Encounter (Signed)
Pt stated he has a bad cold headaches,nose stuffy and a cough.Pt stated he had COVID test done 2 days ago and was negative.  First appointment is for 09/16/2021 virtual/office.   Attempted to reach pt, left VM to call back to discuss symptoms.

## 2021-09-11 NOTE — Telephone Encounter (Signed)
Summary: cold headaches,nose stuffy and a cough.   Pt stated he has a bad cold headaches,nose stuffy and a cough.Pt stated he had COVID test done 2 days ago and was negative.  First appointment is for 09/16/2021 virtual/office.   Pt seeking clinical advice.       3 rd attempt to contact patient regarding symptoms.  No answer, left message on voicemail to call clinic back [phone].

## 2021-10-15 DIAGNOSIS — S62509A Fracture of unspecified phalanx of unspecified thumb, initial encounter for closed fracture: Secondary | ICD-10-CM | POA: Diagnosis not present

## 2021-10-15 DIAGNOSIS — K219 Gastro-esophageal reflux disease without esophagitis: Secondary | ICD-10-CM | POA: Diagnosis not present

## 2021-10-15 DIAGNOSIS — S63641A Sprain of metacarpophalangeal joint of right thumb, initial encounter: Secondary | ICD-10-CM | POA: Diagnosis not present

## 2021-10-15 DIAGNOSIS — M25341 Other instability, right hand: Secondary | ICD-10-CM | POA: Diagnosis not present

## 2021-10-15 DIAGNOSIS — M1990 Unspecified osteoarthritis, unspecified site: Secondary | ICD-10-CM | POA: Diagnosis not present

## 2021-10-15 DIAGNOSIS — I10 Essential (primary) hypertension: Secondary | ICD-10-CM | POA: Diagnosis not present

## 2021-11-28 ENCOUNTER — Ambulatory Visit (INDEPENDENT_AMBULATORY_CARE_PROVIDER_SITE_OTHER): Payer: BC Managed Care – PPO | Admitting: Family Medicine

## 2021-11-28 ENCOUNTER — Encounter: Payer: Self-pay | Admitting: Family Medicine

## 2021-11-28 ENCOUNTER — Other Ambulatory Visit: Payer: Self-pay

## 2021-11-28 VITALS — BP 132/86 | HR 82 | Ht 70.0 in | Wt 208.8 lb

## 2021-11-28 DIAGNOSIS — L309 Dermatitis, unspecified: Secondary | ICD-10-CM

## 2021-11-28 DIAGNOSIS — I10 Essential (primary) hypertension: Secondary | ICD-10-CM

## 2021-11-28 DIAGNOSIS — N529 Male erectile dysfunction, unspecified: Secondary | ICD-10-CM

## 2021-11-28 MED ORDER — TRIAMCINOLONE ACETONIDE 0.5 % EX CREA: 1.0000 "application " | TOPICAL_CREAM | Freq: Two times a day (BID) | CUTANEOUS | 1 refills | Status: DC

## 2021-11-28 MED ORDER — SILDENAFIL CITRATE 20 MG PO TABS: ORAL_TABLET | ORAL | 3 refills | Status: DC

## 2021-11-28 NOTE — Patient Instructions (Addendum)
Thank you for coming to the office today. ? ?Sildenafil sent to [facility] [address] [place] ? ?Colonoscopy 2027 ? ?Ordered cream for leg ? ?The rash looks most consistent with eczema, this can flare up and get worse due to a variety of factors (excessive dry skin from bathing/showering, soaps, cold weather / indoor heaters, outdoor exposures). ? ?Use the topical steroid creams twice a day for up to 1 week, maximum duration of use per one flare is 10 to 14 days, then STOP using it and allow skin to recover. Caution with over-use may cause lightening of the skin.  ? ?Hydrocortisone on face only and the Triamcinolone / Kenalog on body only. ? ?Tips to reduce Eczema Flares: ?For baths/showers, limit bathing to every other day if you can (max 1 x daily)  ?Use a gentle, unscented soap and lukewarm water (hot water is most irritating to skin) ?Never scrub skin with too much pressure, this causes more irritation. ?Pat skin dry, then leave it slightly damp. DO NOT scrub it dry. ?Apply steroid cream to skin and rub in all the way, wait 15 min, then apply a daily moisturizer (Vaseline, Eucerin, Aveeno). Continue daily moisturizer every day of the year (even after flare is resolved) ?- If you have eczema on hands or dry hands, recommend wearing any type of gloves overnight (cloth, fabric, or even nitrile/latex) to improve effect of topical moisturizer ? ?If develops redness, honey colored crust oozing, drainage of pus, bleeding, or redness / swelling, pain, please return for re-evaluation, may have become infected after scratching. ? ? ? ?Please schedule a Follow-up Appointment to: Return in about 5 months (around 04/30/2022) for 5 month HTN follow-up. ? ?If you have any other questions or concerns, please feel free to call the office or send a message through [place]. You may also schedule an earlier appointment if necessary. ? ?Additionally, you may be receiving a survey about your experience at our office within a few days to 1  week by e-mail or mail. We value your feedback. ? ?Nobie Putnam, DO ?[place] ?

## 2021-11-28 NOTE — Assessment & Plan Note (Signed)
Improved HTN control now on med regimen ?Home BP improved ?No known complications  ? ?  ?Plan:  ?1. Continue Amlodipine '10mg'$  daily, Olmesartan '40mg'$  daily ?2. Encourage improved lifestyle - low sodium diet, regular exercise - emphasis on poor diet as cause of BP ?3. Continue monitor BP outside office, bring readings to next visit, if persistently >140/90 or new symptoms notify office sooner ? ?Future consider thiazide if indicated ?

## 2021-11-28 NOTE — Progress Notes (Signed)
? ?Subjective:  ? ? Patient ID: Robert Suarez, male    DOB: 03-06-1966, 56 y.o.   MRN: [id number] ? ?Robert Suarez is a 56 y.o. male presenting on 11/28/2021 for Hypertension ? ? ?HPI ? ?CHRONIC HTN: ?Improved since last visit 11-08/2021, he has been on Amlodipine and Olmesartan ?BP home readings 130/80s ?Works at night, took meds 3am today ?Current Meds - Amlodipine $RemoveBefo'10mg'LABPEfqamOw$  daily, Olmesartan $RemoveBeforeDEI'40mg'VQoEWnrIfugbYfAt$  daily ?Reports good compliance, took meds today. Tolerating well, w/o complaints. ?Denies CP, dyspnea, HA, edema, dizziness / lightheadedness ?  ?Eczema ?Dry skin irritated rash inner aspect both ankles lower leg R only today. Itchy ?Not using any topical ? 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 ?Follow-up from Hand orthopedics at [place], 10/15/21 surgery for reconstruction ulnar collateral ligament R hand. He has had prior surgery. Now this was the repeat. He has some issue with incision healing on palm of thumb. He has just removed cast and started PT ? ?Erectile Dysfunction ?Re order Sildenafil today, goodrx ? ?Internal Hemorrhoid ?Last colonoscopy 2017. Due for repeat 2027 ? ?Depression screen [facility] 2/9 07/18/2021  ?Decreased Interest 0  ?Down, Depressed, Hopeless 0  ?PHQ - 2 Score 0  ?Altered sleeping 0  ?Tired, decreased energy 0  ?Change in appetite 0  ?Feeling bad or failure about yourself  0  ?Trouble concentrating 0  ?Moving slowly or fidgety/restless 0  ?Suicidal thoughts 0  ?PHQ-9 Score 0  ?Difficult doing work/chores Not difficult at all  ? ? ?Social History  ? ?Tobacco Use  ? Smoking status: Never  ? Smokeless tobacco: Never  ?Vaping Use  ? Vaping Use: Never used  ?Substance Use Topics  ? Alcohol use: No  ? Drug use: No  ? ? ?Review of Systems ?Per HPI unless specifically indicated above ? ?   ?Objective:  ?  ?BP 132/86 (BP  Location: Left Arm, Cuff Size: Normal)   Pulse 82   Ht $R'5\' 10"'lv$  (1.778 m)   Wt 208 lb 12.8 oz (94.7 kg)   SpO2 98%   BMI 29.96 kg/m?   ?Wt Readings from Last 3 Encounters:  ?11/28/21 208 lb 12.8 oz (94.7 kg)  ?08/29/21 208 lb 3.2 oz (94.4 kg)  ?07/18/21 209 lb (94.8 kg)  ?  ?Physical Exam ?Vitals and nursing note reviewed.  ?Constitutional:   ?   General: He is not in acute distress. ?   Appearance: He is well-developed. He is not diaphoretic.  ?   Comments: Well-appearing, comfortable, cooperative  ?HENT:  ?   Head: Normocephalic and atraumatic.  ?Eyes:  ?   General:     ?   Right eye: No discharge.     ?   Left eye: No discharge.  ?   Conjunctiva/sclera: Conjunctivae normal.  ?Neck:  ?   Thyroid: No thyromegaly.  ?Cardiovascular:  ?   Rate and Rhythm: Normal rate and regular rhythm.  ?   Pulses: Normal pulses.  ?   Heart sounds: Normal heart sounds. No murmur heard. ?Pulmonary:  ?   Effort: Pulmonary effort is normal. No respiratory distress.  ?   Breath sounds: Normal breath sounds. No wheezing or rales.  ?Musculoskeletal:     ?   General: Normal range of motion.  ?   Cervical back: Normal range of motion and neck supple.  ?  Lymphadenopathy:  ?   Cervical: No cervical adenopathy.  ?Skin: ?   General: Skin is warm and dry.  ?   Findings: No erythema or rash.  ?Neurological:  ?   Mental Status: He is alert and oriented to person, place, and time. Mental status is at baseline.  ?Psychiatric:     ?   Behavior: Behavior normal.  ?   Comments: Well groomed, good eye contact, normal speech and thoughts  ? ? ? ? ?Results for orders placed or performed in visit on 08/29/21  ?COMPLETE METABOLIC PANEL WITH GFR  ?Result Value Ref Range  ? Glucose, Bld 97 65 - 99 mg/dL  ? BUN 10 7 - 25 mg/dL  ? Creat 0.98 0.70 - 1.30 mg/dL  ? eGFR 91 > OR = 60 mL/min/1.75m2  ? BUN/Creatinine Ratio NOT APPLICABLE 6 - 22 (calc)  ? Sodium 142 135 - 146 mmol/L  ? Potassium 4.1 3.5 - 5.3 mmol/L  ? Chloride 107 98 - 110 mmol/L  ? CO2 28 20 - 32  mmol/L  ? Calcium 9.3 8.6 - 10.3 mg/dL  ? Total Protein 6.4 6.1 - 8.1 g/dL  ? Albumin 4.4 3.6 - 5.1 g/dL  ? Globulin 2.0 1.9 - 3.7 g/dL (calc)  ? AG Ratio 2.2 1.0 - 2.5 (calc)  ? Total Bilirubin 0.7 0.2 - 1.2 mg/dL  ? Alkaline phosphatase (APISO) 70 35 - 144 U/L  ? AST 20 10 - 35 U/L  ? ALT 28 9 - 46 U/L  ?CBC with Differential/Platelet  ?Result Value Ref Range  ? WBC 8.0 3.8 - 10.8 Thousand/uL  ? RBC 4.61 4.20 - 5.80 Million/uL  ? Hemoglobin 15.8 13.2 - 17.1 g/dL  ? HCT 45.2 38.5 - 50.0 %  ? MCV 98.0 80.0 - 100.0 fL  ? MCH 34.3 (H) 27.0 - 33.0 pg  ? MCHC 35.0 32.0 - 36.0 g/dL  ? RDW 12.2 11.0 - 15.0 %  ? Platelets 245 140 - 400 Thousand/uL  ? MPV 11.6 7.5 - 12.5 fL  ? Neutro Abs 4,992 1,500 - 7,800 cells/uL  ? Lymphs Abs 1,952 850 - 3,900 cells/uL  ? Absolute Monocytes 880 200 - 950 cells/uL  ? Eosinophils Absolute 128 15 - 500 cells/uL  ? Basophils Absolute 48 0 - 200 cells/uL  ? Neutrophils Relative % 62.4 %  ? Total Lymphocyte 24.4 %  ? Monocytes Relative 11.0 %  ? Eosinophils Relative 1.6 %  ? Basophils Relative 0.6 %  ?Hemoglobin A1c  ?Result Value Ref Range  ? Hgb A1c MFr Bld 5.4 <5.7 % of total Hgb  ? Mean Plasma Glucose 108 mg/dL  ? eAG (mmol/L) 6.0 mmol/L  ?PSA  ?Result Value Ref Range  ? PSA 1.91 < OR = 4.00 ng/mL  ? ?   ?Assessment & Plan:  ? ?Problem List Items Addressed This Visit   ? ? Essential hypertension  ?  Improved HTN control now on med regimen ?Home BP improved ?No known complications  ? ?  ?Plan:  ?1. Continue Amlodipine 10mg  daily, Olmesartan 40mg  daily ?2. Encourage improved lifestyle - low sodium diet, regular exercise - emphasis on poor diet as cause of BP ?3. Continue monitor BP outside office, bring readings to next visit, if persistently >140/90 or new symptoms notify office sooner ? ?Future consider thiazide if indicated ?  ?  ? Relevant Medications  ? sildenafil (REVATIO) 20 MG tablet  ? ?Other Visit Diagnoses   ? ? Eczema, unspecified type    -  Primary  ? Relevant Medications  ?  triamcinolone cream (KENALOG) 0.5 %  ? Erectile dysfunction, unspecified erectile dysfunction type      ? Relevant Medications  ? sildenafil (REVATIO) 20 MG tablet  ? ?  ?  ?ED ?Will re order Sildenafil ? ?Eczema ?Rash on lower leg, episodic consistent with eczema ?Rx Triamcinolone ?Handout per AVS ? ? ?Meds ordered this encounter  ?Medications  ? triamcinolone cream (KENALOG) 0.5 %  ?  Sig: Apply 1 application. topically 2 (two) times daily. To affected areas, for up to 2 weeks.  ?  Dispense:  30 g  ?  Refill:  1  ? sildenafil (REVATIO) 20 MG tablet  ?  Sig: Take 1-5 pills about 30 min prior to sex. Start with 1 and increase as needed.  ?  Dispense:  90 tablet  ?  Refill:  3  ? ? ? ? ?Follow up plan: ?Return in about 5 months (around 04/30/2022) for 5 month HTN follow-up. ? ?Nobie Putnam, DO ?[facility] ?[facility] ?11/28/2021, 10:07 AM ?

## 2022-02-26 ENCOUNTER — Other Ambulatory Visit: Payer: Self-pay

## 2022-02-26 DIAGNOSIS — K222 Esophageal obstruction: Secondary | ICD-10-CM

## 2022-02-26 DIAGNOSIS — R1319 Other dysphagia: Secondary | ICD-10-CM

## 2022-02-26 DIAGNOSIS — K219 Gastro-esophageal reflux disease without esophagitis: Secondary | ICD-10-CM

## 2022-02-26 DIAGNOSIS — I10 Essential (primary) hypertension: Secondary | ICD-10-CM

## 2022-02-26 MED ORDER — AMLODIPINE BESYLATE 10 MG PO TABS: 10.0000 mg | ORAL_TABLET | Freq: Every day | ORAL | 3 refills | Status: DC

## 2022-02-26 MED ORDER — PANTOPRAZOLE SODIUM 40 MG PO TBEC: 40.0000 mg | DELAYED_RELEASE_TABLET | Freq: Every day | ORAL | 3 refills | Status: DC

## 2022-03-06 DIAGNOSIS — S6991XD Unspecified injury of right wrist, hand and finger(s), subsequent encounter: Secondary | ICD-10-CM | POA: Diagnosis not present

## 2022-03-11 ENCOUNTER — Other Ambulatory Visit: Payer: Self-pay | Admitting: Family Medicine

## 2022-03-11 DIAGNOSIS — I10 Essential (primary) hypertension: Secondary | ICD-10-CM

## 2022-03-12 NOTE — Telephone Encounter (Signed)
Requested medication (s) are due for refill today -no  Requested medication (s) are on the active medication list -no  Future visit scheduled -yes  Last refill: unknown  Notes to clinic: medication no longer on current medication list- discontinued due to change in therapy  Requested Prescriptions  Pending Prescriptions Disp Refills   lisinopril (ZESTRIL) 20 MG tablet [Pharmacy Med Name: LISINOPRIL '20MG'$  TABLETS] 90 tablet 3    Sig: TAKE 1 TABLET(20 MG) BY MOUTH DAILY     Cardiovascular:  ACE Inhibitors Failed - 03/11/2022  8:34 PM      Failed - Cr in normal range and within 180 days    Creat  Date Value Ref Range Status  08/29/2021 0.98 0.70 - 1.30 mg/dL Final         Failed - K in normal range and within 180 days    Potassium  Date Value Ref Range Status  08/29/2021 4.1 3.5 - 5.3 mmol/L Final         Passed - Patient is not pregnant      Passed - Last BP in normal range    BP Readings from Last 1 Encounters:  11/28/21 132/86         Passed - Valid encounter within last 6 months    Recent Outpatient Visits           3 months ago Eczema, unspecified type   [place], DO   6 months ago Essential hypertension   [place], DO   7 months ago Chronic pain of right thumb   [place], DO       Future Appointments             In 1 month Parks Ranger, Devonne Doughty, DO [facility], [facility]               Requested Prescriptions  Pending Prescriptions Disp Refills   lisinopril (ZESTRIL) 20 MG tablet [Pharmacy Med Name: LISINOPRIL '20MG'$  TABLETS] 90 tablet 3    Sig: TAKE 1 TABLET(20 MG) BY MOUTH DAILY     Cardiovascular:  ACE Inhibitors Failed - 03/11/2022  8:34 PM      Failed - Cr in normal range and within 180 days    Creat  Date Value Ref Range Status  08/29/2021 0.98 0.70 - 1.30 mg/dL Final         Failed - K in normal  range and within 180 days    Potassium  Date Value Ref Range Status  08/29/2021 4.1 3.5 - 5.3 mmol/L Final         Passed - Patient is not pregnant      Passed - Last BP in normal range    BP Readings from Last 1 Encounters:  11/28/21 132/86         Passed - Valid encounter within last 6 months    Recent Outpatient Visits           3 months ago Eczema, unspecified type   [place], DO   6 months ago Essential hypertension   [place], DO   7 months ago Chronic pain of right thumb   [place], DO       Future Appointments             In 1 month Parks Ranger, Devonne Doughty,  [place]

## 2022-04-30 ENCOUNTER — Other Ambulatory Visit: Payer: Self-pay | Admitting: Family Medicine

## 2022-04-30 ENCOUNTER — Ambulatory Visit (INDEPENDENT_AMBULATORY_CARE_PROVIDER_SITE_OTHER): Payer: BC Managed Care – PPO | Admitting: Family Medicine

## 2022-04-30 ENCOUNTER — Encounter: Payer: Self-pay | Admitting: Family Medicine

## 2022-04-30 VITALS — BP 150/98 | HR 86 | Ht 70.0 in | Wt 202.8 lb

## 2022-04-30 DIAGNOSIS — I10 Essential (primary) hypertension: Secondary | ICD-10-CM | POA: Diagnosis not present

## 2022-04-30 DIAGNOSIS — M79675 Pain in left toe(s): Secondary | ICD-10-CM | POA: Diagnosis not present

## 2022-04-30 DIAGNOSIS — K529 Noninfective gastroenteritis and colitis, unspecified: Secondary | ICD-10-CM | POA: Diagnosis not present

## 2022-04-30 DIAGNOSIS — E7800 Pure hypercholesterolemia, unspecified: Secondary | ICD-10-CM

## 2022-04-30 DIAGNOSIS — N529 Male erectile dysfunction, unspecified: Secondary | ICD-10-CM

## 2022-04-30 DIAGNOSIS — Z0000 Encounter for general adult medical examination without abnormal findings: Secondary | ICD-10-CM

## 2022-04-30 DIAGNOSIS — R7309 Other abnormal glucose: Secondary | ICD-10-CM

## 2022-04-30 DIAGNOSIS — M109 Gout, unspecified: Secondary | ICD-10-CM

## 2022-04-30 DIAGNOSIS — Z125 Encounter for screening for malignant neoplasm of prostate: Secondary | ICD-10-CM

## 2022-04-30 DIAGNOSIS — R351 Nocturia: Secondary | ICD-10-CM

## 2022-04-30 MED ORDER — TADALAFIL 20 MG PO TABS
20.0000 mg | ORAL_TABLET | ORAL | 3 refills | Status: DC | PRN
Start: 2022-04-30 — End: 2023-05-18

## 2022-04-30 NOTE — Patient Instructions (Addendum)
Thank you for coming to the office today.  May have IBS or Irritable Bowel Syndrome  OTC Peppermint Oil IBGard (Triple Coated Capsule) '180mg'$  take one 3 times daily to reduce diarrhea  Try Fiber Supplement like Metamucil OTC to bulk stool and promote more regular BMs  -----------------  Please try to limit sodium, caffeine, reduce sodas and drink more water.  The Left Toe may be gout. Will check Uric Acid in the blood  Gout flares can repeat again soon after they resolve in the same spot or other joints, and may need repeat treatment.  Our goal is to prevent future gout flares. Try to avoid dietary triggers that are the most common causes of gout flares. - Avoid the following foods/drinks: - Red meat, organ meat (liver) - Alcohol (especially beer, also wine, liquor) - Processed foods / carbs (white bread, white rice, pasta, sugar) - Sugary drinks (sweet tea, soda) - Shellfish, shrimp / lobster  - Foods that are preferred to eat: - Beans, Lentils, Whole grains, Quinoa - Fruits, Vegetables - Dairy, Cheese, Yogurt - Soy based protein   DUE for FASTING BLOOD WORK (no food or drink after midnight before the lab appointment, only water or coffee without cream/sugar on the morning of)  SCHEDULE "Lab Only" visit in the morning at the clinic for lab draw in 4 MONTHS   - Make sure Lab Only appointment is at about 1 week before your next appointment, so that results will be available  For Lab Results, once available within 2-3 days of blood draw, you can can log in to MyChart online to view your results and a brief explanation. Also, we can discuss results at next follow-up visit.   Please schedule a Follow-up Appointment to: Return in 4 months (on 08/30/2022) for 4 month fasting lab only then 1 week later Annual Physical.  If you have any other questions or concerns, please feel free to call the office or send a message through [place]. You may also schedule an earlier appointment if  necessary.  Additionally, you may be receiving a survey about your experience at our office within a few days to 1 week by e-mail or mail. We value your feedback.  Nobie Putnam, DO [facility], [facility]  DASH Eating Plan DASH stands for Dietary Approaches to Stop Hypertension. The DASH eating plan is a healthy eating plan that has been shown to: Reduce high blood pressure (hypertension). Reduce your risk for type 2 diabetes, heart disease, and stroke. Help with weight loss. What are tips for following this plan? Reading food labels Check food labels for the amount of salt (sodium) per serving. Choose foods with less than 5 percent of the Daily Value of sodium. Generally, foods with less than 300 milligrams (mg) of sodium per serving fit into this eating plan. To find whole grains, look for the word "whole" as the first word in the ingredient list. Shopping Buy products labeled as "low-sodium" or "no salt added." Buy fresh foods. Avoid canned foods and pre-made or frozen meals. Cooking Avoid adding salt when cooking. Use salt-free seasonings or herbs instead of table salt or sea salt. Check with your health care provider or pharmacist before using salt substitutes. Do not fry foods. Cook foods using healthy methods such as baking, boiling, grilling, roasting, and broiling instead. Cook with heart-healthy oils, such as olive, canola, avocado, soybean, or sunflower oil. Meal planning  Eat a balanced diet that includes: 4 or more servings of fruits and 4  or more servings of vegetables each day. Try to fill one-half of your plate with fruits and vegetables. 6-8 servings of whole grains each day. Less than 6 oz (170 g) of lean meat, poultry, or fish each day. A 3-oz (85-g) serving of meat is about the same size as a deck of cards. One egg equals 1 oz (28 g). 2-3 servings of low-fat dairy each day. One serving is 1 cup (237 mL). 1 serving of nuts, seeds, or beans 5 times  each week. 2-3 servings of heart-healthy fats. Healthy fats called omega-3 fatty acids are found in foods such as walnuts, flaxseeds, fortified milks, and eggs. These fats are also found in cold-water fish, such as sardines, salmon, and mackerel. Limit how much you eat of: Canned or prepackaged foods. Food that is high in trans fat, such as some fried foods. Food that is high in saturated fat, such as fatty meat. Desserts and other sweets, sugary drinks, and other foods with added sugar. Full-fat dairy products. Do not salt foods before eating. Do not eat more than 4 egg yolks a week. Try to eat at least 2 vegetarian meals a week. Eat more home-cooked food and less restaurant, buffet, and fast food. Lifestyle When eating at a restaurant, ask that your food be prepared with less salt or no salt, if possible. If you drink alcohol: Limit how much you use to: 0-1 drink a day for women who are not pregnant. 0-2 drinks a day for men. Be aware of how much alcohol is in your drink. In the [place] one drink equals one 12 oz bottle of beer (355 mL), one 5 oz glass of wine (148 mL), or one 1 oz glass of hard liquor (44 mL). General information Avoid eating more than 2,300 mg of salt a day. If you have hypertension, you may need to reduce your sodium intake to 1,500 mg a day. Work with your health care provider to maintain a healthy body weight or to lose weight. Ask what an ideal weight is for you. Get at least 30 minutes of exercise that causes your heart to beat faster (aerobic exercise) most days of the week. Activities may include walking, swimming, or biking. Work with your health care provider or dietitian to adjust your eating plan to your individual calorie needs. What foods should I eat? Fruits All fresh, dried, or frozen fruit. Canned fruit in natural juice (without added sugar). Vegetables Fresh or frozen vegetables (raw, steamed, roasted, or grilled). Low-sodium or reduced-sodium tomato  and vegetable juice. Low-sodium or reduced-sodium tomato sauce and tomato paste. Low-sodium or reduced-sodium canned vegetables. Grains Whole-grain or whole-wheat bread. Whole-grain or whole-wheat pasta. Brown rice. [place]. Bulgur. Whole-grain and low-sodium cereals. Pita bread. Low-fat, low-sodium crackers. Whole-wheat flour tortillas. Meats and other proteins Skinless chicken or [place]. Ground chicken or [place]. Pork with fat trimmed off. Fish and seafood. Egg whites. Dried beans, peas, or lentils. Unsalted nuts, nut butters, and seeds. Unsalted canned beans. Lean cuts of beef with fat trimmed off. Low-sodium, lean precooked or cured meat, such as sausages or meat loaves. Dairy Low-fat (1%) or fat-free (skim) milk. Reduced-fat, low-fat, or fat-free cheeses. Nonfat, low-sodium ricotta or cottage cheese. Low-fat or nonfat yogurt. Low-fat, low-sodium cheese. Fats and oils Soft margarine without trans fats. Vegetable oil. Reduced-fat, low-fat, or light mayonnaise and salad dressings (reduced-sodium). Canola, safflower, olive, avocado, soybean, and sunflower oils. Avocado. Seasonings and condiments Herbs. Spices. Seasoning mixes without salt. Other foods Unsalted popcorn and pretzels. Fat-free sweets.  The items listed above may not be a complete list of foods and beverages you can eat. Contact a dietitian for more information. What foods should I avoid? Fruits Canned fruit in a light or heavy syrup. Fried fruit. Fruit in cream or butter sauce. Vegetables Creamed or fried vegetables. Vegetables in a cheese sauce. Regular canned vegetables (not low-sodium or reduced-sodium). Regular canned tomato sauce and paste (not low-sodium or reduced-sodium). Regular tomato and vegetable juice (not low-sodium or reduced-sodium). Angie Fava. Olives. Grains Baked goods made with fat, such as croissants, muffins, or some breads. Dry pasta or rice meal packs. Meats and other proteins Fatty cuts of meat. Ribs.  Fried meat. Berniece Salines. Bologna, salami, and other precooked or cured meats, such as sausages or meat loaves. Fat from the back of a pig (fatback). Bratwurst. Salted nuts and seeds. Canned beans with added salt. Canned or smoked fish. Whole eggs or egg yolks. Chicken or [place] with skin. Dairy Whole or 2% milk, cream, and half-and-half. Whole or full-fat cream cheese. Whole-fat or sweetened yogurt. Full-fat cheese. Nondairy creamers. Whipped toppings. Processed cheese and cheese spreads. Fats and oils Butter. Stick margarine. Lard. Shortening. Ghee. Bacon fat. Tropical oils, such as coconut, palm kernel, or palm oil. Seasonings and condiments Onion salt, garlic salt, seasoned salt, table salt, and sea salt. Worcestershire sauce. Tartar sauce. Barbecue sauce. Teriyaki sauce. Soy sauce, including reduced-sodium. Steak sauce. Canned and packaged gravies. Fish sauce. Oyster sauce. Cocktail sauce. Store-bought horseradish. Ketchup. Mustard. Meat flavorings and tenderizers. Bouillon cubes. Hot sauces. Pre-made or packaged marinades. Pre-made or packaged taco seasonings. Relishes. Regular salad dressings. Other foods Salted popcorn and pretzels. The items listed above may not be a complete list of foods and beverages you should avoid. Contact a dietitian for more information. Where to find more information National Heart, Lung, and Blood Institute: https://wilson-eaton.com/ American Heart Association: www.heart.org Academy of Nutrition and Dietetics: www.eatright.[place]: www.kidney.org Summary The DASH eating plan is a healthy eating plan that has been shown to reduce high blood pressure (hypertension). It may also reduce your risk for type 2 diabetes, heart disease, and stroke. When on the DASH eating plan, aim to eat more fresh fruits and vegetables, whole grains, lean proteins, low-fat dairy, and heart-healthy fats. With the DASH eating plan, you should limit salt (sodium) intake to 2,300 mg  a day. If you have hypertension, you may need to reduce your sodium intake to 1,500 mg a day. Work with your health care provider or dietitian to adjust your eating plan to your individual calorie needs. This information is not intended to replace advice given to you by your health care provider. Make sure you discuss any questions you have with your health care provider. Document Revised: 08/04/2019 Document Reviewed: 08/04/2019 Elsevier Patient Education  [place].

## 2022-04-30 NOTE — Progress Notes (Signed)
Subjective:    Patient ID: Robert Suarez, male    DOB: 1966/06/16, 56 y.o.   MRN: [id number]  Robert Suarez is a 56 y.o. male presenting on 04/30/2022 for Hypertension   HPI  Postprandial Diarrhea, functional bowel problem Reports after every meal he has to go to bathroom, BM 3-4 times a day often loose Admits eating junk foods, caffeine, sodas Less water Limited fiber  CHRONIC HTN: Reports elevated BP at home 150-160, checking occasionally Current Meds - Amlodipine $RemoveBefo'10mg'EOTIyyXTCpi$  daily, Olmesartan $RemoveBeforeDEI'40mg'bmoNexdurExRadSG$  daily Reports good compliance, took meds today. Tolerating well, w/o complaints. Lifestyle: - Diet: admits poor diet Denies CP, dyspnea, HA, edema, dizziness / lightheadedness   Left Toe Pain, episodic Great toe has episodic pain, worse with steel toed boots, says he has flat feet and not using the costly inserts anymore, seems boot rubs against toe some. No other problem or injury Unsure if it is gout  Dysphagia GERD Followed by Dr Allen Norris [facility], last EGD 2021 Has had issue with stricture and narrowing. Improved on PPI Pantoprazole $RemoveBeforeDE'40mg'WYyBhZxBbmgCabI$  daily     04/30/2022    9:55 AM 07/18/2021   10:02 AM  Depression screen PHQ 2/9  Decreased Interest 0 0  Down, Depressed, Hopeless 0 0  PHQ - 2 Score 0 0  Altered sleeping 0 0  Tired, decreased energy 0 0  Change in appetite 0 0  Feeling bad or failure about yourself  0 0  Trouble concentrating 0 0  Moving slowly or fidgety/restless 0 0  Suicidal thoughts 0 0  PHQ-9 Score 0 0  Difficult doing work/chores Not difficult at all Not difficult at all    Social History   Tobacco Use   Smoking status: Never   Smokeless tobacco: Never  Vaping Use   Vaping Use: Never used  Substance Use Topics   Alcohol use: No   Drug use: No    Review of Systems Per HPI unless specifically indicated above     Objective:    BP (!) 150/98 (BP Location: Left Arm, Cuff Size: Normal)   Pulse 86   Ht $R'5\' 10"'lq$  (1.778 m)   Wt 202 lb 12.8 oz (92 kg)   SpO2 98%    BMI 29.10 kg/m   Wt Readings from Last 3 Encounters:  04/30/22 202 lb 12.8 oz (92 kg)  11/28/21 208 lb 12.8 oz (94.7 kg)  08/29/21 208 lb 3.2 oz (94.4 kg)    Physical Exam Vitals and nursing note reviewed.  Constitutional:      General: He is not in acute distress.    Appearance: He is well-developed. He is not diaphoretic.     Comments: Well-appearing, comfortable, cooperative  HENT:     Head: Normocephalic and atraumatic.  Eyes:     General:        Right eye: No discharge.        Left eye: No discharge.     Conjunctiva/sclera: Conjunctivae normal.  Neck:     Thyroid: No thyromegaly.  Cardiovascular:     Rate and Rhythm: Normal rate and regular rhythm.     Pulses: Normal pulses.     Heart sounds: Normal heart sounds. No murmur heard. Pulmonary:     Effort: Pulmonary effort is normal. No respiratory distress.     Breath sounds: Normal breath sounds. No wheezing or rales.  Musculoskeletal:        General: Normal range of motion.     Cervical back: Normal range of motion and neck supple.  Lymphadenopathy:     Cervical: No cervical adenopathy.  Skin:    General: Skin is warm and dry.     Findings: No erythema or rash.  Neurological:     Mental Status: He is alert and oriented to person, place, and time. Mental status is at baseline.  Psychiatric:        Behavior: Behavior normal.     Comments: Well groomed, good eye contact, normal speech and thoughts      Results for orders placed or performed in visit on 08/29/21  COMPLETE METABOLIC PANEL WITH GFR  Result Value Ref Range   Glucose, Bld 97 65 - 99 mg/dL   BUN 10 7 - 25 mg/dL   Creat 0.98 0.70 - 1.30 mg/dL   eGFR 91 > OR = 60 mL/min/1.54m2   BUN/Creatinine Ratio NOT APPLICABLE 6 - 22 (calc)   Sodium 142 135 - 146 mmol/L   Potassium 4.1 3.5 - 5.3 mmol/L   Chloride 107 98 - 110 mmol/L   CO2 28 20 - 32 mmol/L   Calcium 9.3 8.6 - 10.3 mg/dL   Total Protein 6.4 6.1 - 8.1 g/dL   Albumin 4.4 3.6 - 5.1 g/dL    Globulin 2.0 1.9 - 3.7 g/dL (calc)   AG Ratio 2.2 1.0 - 2.5 (calc)   Total Bilirubin 0.7 0.2 - 1.2 mg/dL   Alkaline phosphatase (APISO) 70 35 - 144 U/L   AST 20 10 - 35 U/L   ALT 28 9 - 46 U/L  CBC with Differential/Platelet  Result Value Ref Range   WBC 8.0 3.8 - 10.8 Thousand/uL   RBC 4.61 4.20 - 5.80 Million/uL   Hemoglobin 15.8 13.2 - 17.1 g/dL   HCT 45.2 38.5 - 50.0 %   MCV 98.0 80.0 - 100.0 fL   MCH 34.3 (H) 27.0 - 33.0 pg   MCHC 35.0 32.0 - 36.0 g/dL   RDW 12.2 11.0 - 15.0 %   Platelets 245 140 - 400 Thousand/uL   MPV 11.6 7.5 - 12.5 fL   Neutro Abs 4,992 1,500 - 7,800 cells/uL   Lymphs Abs 1,952 850 - 3,900 cells/uL   Absolute Monocytes 880 200 - 950 cells/uL   Eosinophils Absolute 128 15 - 500 cells/uL   Basophils Absolute 48 0 - 200 cells/uL   Neutrophils Relative % 62.4 %   Total Lymphocyte 24.4 %   Monocytes Relative 11.0 %   Eosinophils Relative 1.6 %   Basophils Relative 0.6 %  Hemoglobin A1c  Result Value Ref Range   Hgb A1c MFr Bld 5.4 <5.7 % of total Hgb   Mean Plasma Glucose 108 mg/dL   eAG (mmol/L) 6.0 mmol/L  PSA  Result Value Ref Range   PSA 1.91 < OR = 4.00 ng/mL      Assessment & Plan:   Problem List Items Addressed This Visit     Essential hypertension - Primary    Significantly elevated BP, repeat limited improvement Home BP elevated No known complications    Plan:  1. Discussion on improving lifestyle first one more chance prior to changing meds - and improving med adherence - Less soda and more water -  Continue Amlodipine 10mg  daily, Olmesartan 40mg  daily 2. Encourage improved lifestyle - low sodium diet, regular exercise - emphasis on poor diet as cause of BP 3. Continue monitor BP outside office, bring readings to next visit, if persistently >140/90 or new symptoms notify office sooner      Relevant Medications   tadalafil (  CIALIS) 20 MG tablet   Other Visit Diagnoses     Postprandial diarrhea       Great toe pain, left        Relevant Orders   Uric acid   Acute gout involving toe of left foot, unspecified cause       Relevant Orders   Uric acid   Erectile dysfunction, unspecified erectile dysfunction type       Relevant Medications   tadalafil (CIALIS) 20 MG tablet       May have IBS or Irritable Bowel Syndrome OTC Peppermint Oil IBGard (Triple Coated Capsule) $RemoveBeforeD'180mg'AarPoJsCkSikOi$  take one 3 times daily to reduce diarrhea Try Fiber Supplement like Metamucil OTC to bulk stool and promote more regular BMs  -----------------  Possible L Toe gout recurrent flares Will check Uric Acid in the blood  ED DC Sildenafil, less effective now Rx Tadalafil $RemoveBefo'20mg'DyzJxoJUkvI$  q 48 hr PRN trial Future Urology if indicated  --- DASH diet and Gout diet info given AVS  Meds ordered this encounter  Medications   tadalafil (CIALIS) 20 MG tablet    Sig: Take 1 tablet (20 mg total) by mouth every other day as needed for erectile dysfunction.    Dispense:  90 tablet    Refill:  3      Follow up plan: Return in 4 months (on 08/30/2022) for 4 month fasting lab only then 1 week later Annual Physical.  Future labs ordered.  Nobie Putnam, [facility] 04/30/2022, 10:04 AM

## 2022-04-30 NOTE — Assessment & Plan Note (Addendum)
Significantly elevated BP, repeat limited improvement Home BP elevated No known complications    Plan:  1. Discussion on improving lifestyle first one more chance prior to changing meds - and improving med adherence - Less soda and more water -  Continue Amlodipine '10mg'$  daily, Olmesartan '40mg'$  daily 2. Encourage improved lifestyle - low sodium diet, regular exercise - emphasis on poor diet as cause of BP 3. Continue monitor BP outside office, bring readings to next visit, if persistently >140/90 or new symptoms notify office sooner

## 2022-05-01 LAB — URIC ACID: Uric Acid, Serum: 6.7 mg/dL (ref 4.0–8.0)

## 2022-08-19 ENCOUNTER — Encounter: Payer: Self-pay | Admitting: Family Medicine

## 2022-08-19 ENCOUNTER — Ambulatory Visit (INDEPENDENT_AMBULATORY_CARE_PROVIDER_SITE_OTHER): Payer: BC Managed Care – PPO | Admitting: Family Medicine

## 2022-08-19 VITALS — BP 154/90 | HR 94 | Temp 99.9°F | Ht 70.0 in | Wt 207.0 lb

## 2022-08-19 DIAGNOSIS — I10 Essential (primary) hypertension: Secondary | ICD-10-CM | POA: Diagnosis not present

## 2022-08-19 DIAGNOSIS — J40 Bronchitis, not specified as acute or chronic: Secondary | ICD-10-CM | POA: Diagnosis not present

## 2022-08-19 DIAGNOSIS — J069 Acute upper respiratory infection, unspecified: Secondary | ICD-10-CM

## 2022-08-19 LAB — POCT INFLUENZA A/B
Influenza A, POC: NEGATIVE
Influenza B, POC: NEGATIVE

## 2022-08-19 LAB — POC COVID19 BINAXNOW: SARS Coronavirus 2 Ag: NEGATIVE

## 2022-08-19 MED ORDER — BENZONATATE 100 MG PO CAPS: 100.0000 mg | ORAL_CAPSULE | Freq: Three times a day (TID) | ORAL | 0 refills | Status: DC | PRN

## 2022-08-19 MED ORDER — AZITHROMYCIN 250 MG PO TABS: ORAL_TABLET | ORAL | 0 refills | Status: DC

## 2022-08-19 MED ORDER — AMLODIPINE BESYLATE 10 MG PO TABS: 10.0000 mg | ORAL_TABLET | Freq: Every day | ORAL | 12 refills | Status: DC

## 2022-08-19 MED ORDER — IPRATROPIUM BROMIDE 0.06 % NA SOLN: 2.0000 | Freq: Four times a day (QID) | NASAL | 0 refills | Status: DC

## 2022-08-19 MED ORDER — OLMESARTAN MEDOXOMIL 40 MG PO TABS: 40.0000 mg | ORAL_TABLET | Freq: Every day | ORAL | 12 refills | Status: DC

## 2022-08-19 NOTE — Patient Instructions (Addendum)
Thank you for coming to the office today.    Please schedule a Follow-up Appointment to: Return if symptoms worsen or fail to improve.  If you have any other questions or concerns, please feel free to call the office or send a message through MyChart. You may also schedule an earlier appointment if necessary.  Additionally, you may be receiving a survey about your experience at our office within a few days to 1 week by e-mail or mail. We value your feedback.  Kailin Principato, DO [facility], [facility] 

## 2022-08-19 NOTE — Progress Notes (Signed)
Subjective:    Patient ID: Robert Suarez, male    DOB: 10/20/1965, 56 y.o.   MRN: [id number]  [name] is a 56 y.o. male presenting on 08/19/2022 for Cough and Sore Throat  Patient presents for a same day appointment.  HPI  Viral Syndrome Reports recent onset 4 days ago with possible fever body ache sinus congestion drainage, cough, headache sore throat. Sick contacts within past 1-2 weeks at home, one diagnosed with RSV. No covid test or other testing done. Request testing today. Missed work due to illness.  HYPERTENSION Elevated BP without current medication, ran out needs new orders.  Did not take Flu Shot this season.     08/19/2022    3:24 PM 04/30/2022    9:55 AM 07/18/2021   10:02 AM  Depression screen PHQ 2/9  Decreased Interest 0 0 0  Down, Depressed, Hopeless 0 0 0  PHQ - 2 Score 0 0 0  Altered sleeping 0 0 0  Tired, decreased energy 0 0 0  Change in appetite 0 0 0  Feeling bad or failure about yourself  0 0 0  Trouble concentrating 0 0 0  Moving slowly or fidgety/restless 0 0 0  Suicidal thoughts 0 0 0  PHQ-9 Score 0 0 0  Difficult doing work/chores Not difficult at all Not difficult at all Not difficult at all    Social History   Tobacco Use   Smoking status: Never   Smokeless tobacco: Never  Vaping Use   Vaping Use: Never used  Substance Use Topics   Alcohol use: No   Drug use: No    Review of Systems Per HPI unless specifically indicated above     Objective:    BP (!) 154/90 (BP Location: Left Arm, Cuff Size: Normal)   Pulse 94   Temp 99.9 F (37.7 C) (Oral)   Ht '5\' 10"'$  (1.778 m)   Wt 207 lb (93.9 kg)   SpO2 98%   BMI 29.70 kg/m   Wt Readings from Last 3 Encounters:  08/19/22 207 lb (93.9 kg)  04/30/22 202 lb 12.8 oz (92 kg)  11/28/21 208 lb 12.8 oz (94.7 kg)    Physical Exam Vitals and nursing note reviewed.  Constitutional:      General: He is not in acute distress.    Appearance: Normal appearance. He is well-developed. He is  not diaphoretic.     Comments: Well-appearing, comfortable, cooperative  HENT:     Head: Normocephalic and atraumatic.  Eyes:     General:        Right eye: No discharge.        Left eye: No discharge.     Conjunctiva/sclera: Conjunctivae normal.  Neck:     Thyroid: No thyromegaly.  Cardiovascular:     Rate and Rhythm: Normal rate and regular rhythm.     Pulses: Normal pulses.     Heart sounds: Normal heart sounds. No murmur heard. Pulmonary:     Effort: Pulmonary effort is normal. No respiratory distress.     Breath sounds: Normal breath sounds. No wheezing or rales.  Musculoskeletal:        General: Normal range of motion.     Cervical back: Normal range of motion and neck supple.  Lymphadenopathy:     Cervical: No cervical adenopathy.  Skin:    General: Skin is warm and dry.     Findings: No erythema or rash.  Neurological:     Mental Status: He is alert  and oriented to person, place, and time. Mental status is at baseline.  Psychiatric:        Mood and Affect: Mood normal.        Behavior: Behavior normal.        Thought Content: Thought content normal.     Comments: Well groomed, good eye contact, normal speech and thoughts     Results for orders placed or performed in visit on 08/19/22  POC COVID-19  Result Value Ref Range   SARS Coronavirus 2 Ag Negative Negative  POCT Influenza A/B  Result Value Ref Range   Influenza A, POC Negative Negative   Influenza B, POC Negative Negative      Assessment & Plan:   Problem List Items Addressed This Visit     Essential hypertension   Relevant Medications   amLODipine (NORVASC) 10 MG tablet   olmesartan (BENICAR) 40 MG tablet   Other Visit Diagnoses     Viral URI with cough    -  Primary   Relevant Medications   benzonatate (TESSALON) 100 MG capsule   ipratropium (ATROVENT) 0.06 % nasal spray   azithromycin (ZITHROMAX Z-PAK) 250 MG tablet   Other Relevant Orders   POC COVID-19 (Completed)   POCT Influenza A/B  (Completed)   Bronchitis       Relevant Medications   azithromycin (ZITHROMAX Z-PAK) 250 MG tablet       Consistent with viral URI  x 4 days, with multiple known sick contacts (similar URI symptoms). Afebrile, well hydrated on exam, no focal signs of infection (ears, throat, lungs clear).  COVID / FLU POC Testing here today NEGATIVE  Plan: 1. Reassurance, likely self-limited  - may have lingering cough - Start Atrovent nasal spray decongestant 2 sprays each nostril up to 4 times daily for 5-7 days - Start Tessalon Perls take 1 capsule up to 3 times a day as needed for cough  OTC cough medicine as needed, caution with sudafed due to HYPERTENSION Improve hydration Tylenol / Motrin PRN fevers Return criteria given  Work note  Add Zpak as back up plan by day 5-7 if not improving or worsening.  #HYPERTENSION Uncontrolled, off medication, currently out needs new orders.  Meds ordered this encounter  Medications   benzonatate (TESSALON) 100 MG capsule    Sig: Take 1 capsule (100 mg total) by mouth 3 (three) times daily as needed for cough.    Dispense:  30 capsule    Refill:  0   ipratropium (ATROVENT) 0.06 % nasal spray    Sig: Place 2 sprays into both nostrils 4 (four) times daily. For up to 5-7 days then stop.    Dispense:  15 mL    Refill:  0   azithromycin (ZITHROMAX Z-PAK) 250 MG tablet    Sig: Take 2 tabs ('500mg'$  total) on Day 1. Take 1 tab ('250mg'$ ) daily for next 4 days.    Dispense:  6 tablet    Refill:  0   amLODipine (NORVASC) 10 MG tablet    Sig: Take 1 tablet (10 mg total) by mouth daily.    Dispense:  30 tablet    Refill:  12   olmesartan (BENICAR) 40 MG tablet    Sig: Take 1 tablet (40 mg total) by mouth daily.    Dispense:  30 tablet    Refill:  12      Follow up plan: Return if symptoms worsen or fail to improve.  Robert Putnam, DO [facility]  [facility] 08/19/2022, 3:09 PM

## 2023-03-02 ENCOUNTER — Other Ambulatory Visit: Payer: Self-pay

## 2023-03-02 ENCOUNTER — Other Ambulatory Visit: Payer: BC Managed Care – PPO

## 2023-03-02 DIAGNOSIS — I10 Essential (primary) hypertension: Secondary | ICD-10-CM | POA: Diagnosis not present

## 2023-03-02 DIAGNOSIS — Z0000 Encounter for general adult medical examination without abnormal findings: Secondary | ICD-10-CM

## 2023-03-02 DIAGNOSIS — R7309 Other abnormal glucose: Secondary | ICD-10-CM | POA: Diagnosis not present

## 2023-03-02 DIAGNOSIS — Z13220 Encounter for screening for lipoid disorders: Secondary | ICD-10-CM

## 2023-03-02 DIAGNOSIS — N529 Male erectile dysfunction, unspecified: Secondary | ICD-10-CM | POA: Diagnosis not present

## 2023-03-03 LAB — CBC WITH DIFFERENTIAL/PLATELET
Absolute Monocytes: 883 cells/uL (ref 200–950)
Basophils Absolute: 28 cells/uL (ref 0–200)
Basophils Relative: 0.4 %
Eosinophils Absolute: 110 cells/uL (ref 15–500)
Eosinophils Relative: 1.6 %
HCT: 41.4 % (ref 38.5–50.0)
Hemoglobin: 13.8 g/dL (ref 13.2–17.1)
Lymphs Abs: 1684 cells/uL (ref 850–3900)
MCH: 33.1 pg — ABNORMAL HIGH (ref 27.0–33.0)
MCHC: 33.3 g/dL (ref 32.0–36.0)
MCV: 99.3 fL (ref 80.0–100.0)
MPV: 11.3 fL (ref 7.5–12.5)
Monocytes Relative: 12.8 %
Neutro Abs: 4195 cells/uL (ref 1500–7800)
Neutrophils Relative %: 60.8 %
Platelets: 176 10*3/uL (ref 140–400)
RBC: 4.17 10*6/uL — ABNORMAL LOW (ref 4.20–5.80)
RDW: 12.2 % (ref 11.0–15.0)
Total Lymphocyte: 24.4 %
WBC: 6.9 10*3/uL (ref 3.8–10.8)

## 2023-03-03 LAB — LIPID PANEL
Cholesterol: 172 mg/dL (ref <200)
HDL: 52 mg/dL (ref >=40)
LDL Cholesterol (Calc): 100 mg/dL (calc) — ABNORMAL HIGH
Non-HDL Cholesterol (Calc): 120 mg/dL (calc) (ref <130)
Total CHOL/HDL Ratio: 3.3 (calc) (ref <5.0)
Triglycerides: 108 mg/dL (ref <150)

## 2023-03-03 LAB — COMPREHENSIVE METABOLIC PANEL
AG Ratio: 2.8 (calc) — ABNORMAL HIGH (ref 1.0–2.5)
ALT: 15 U/L (ref 9–46)
AST: 14 U/L (ref 10–35)
Albumin: 4.4 g/dL (ref 3.6–5.1)
Alkaline phosphatase (APISO): 62 U/L (ref 35–144)
BUN: 16 mg/dL (ref 7–25)
CO2: 27 mmol/L (ref 20–32)
Calcium: 9.3 mg/dL (ref 8.6–10.3)
Chloride: 107 mmol/L (ref 98–110)
Creat: 1.06 mg/dL (ref 0.70–1.30)
Globulin: 1.6 g/dL (calc) — ABNORMAL LOW (ref 1.9–3.7)
Glucose, Bld: 94 mg/dL (ref 65–99)
Potassium: 4 mmol/L (ref 3.5–5.3)
Sodium: 143 mmol/L (ref 135–146)
Total Bilirubin: 0.7 mg/dL (ref 0.2–1.2)
Total Protein: 6 g/dL — ABNORMAL LOW (ref 6.1–8.1)

## 2023-03-03 LAB — HEMOGLOBIN A1C
Hgb A1c MFr Bld: 5.3 % of total Hgb (ref <5.7)
Mean Plasma Glucose: 105 mg/dL
eAG (mmol/L): 5.8 mmol/L

## 2023-03-03 LAB — PSA: PSA: 1.96 ng/mL (ref <=4.00)

## 2023-03-09 ENCOUNTER — Encounter: Payer: Self-pay | Admitting: Family Medicine

## 2023-03-09 ENCOUNTER — Ambulatory Visit (INDEPENDENT_AMBULATORY_CARE_PROVIDER_SITE_OTHER): Payer: BC Managed Care – PPO | Admitting: Family Medicine

## 2023-03-09 VITALS — BP 138/72 | HR 81 | Temp 99.0°F | Resp 18 | Ht 70.0 in | Wt 207.0 lb

## 2023-03-09 DIAGNOSIS — K219 Gastro-esophageal reflux disease without esophagitis: Secondary | ICD-10-CM

## 2023-03-09 DIAGNOSIS — R1319 Other dysphagia: Secondary | ICD-10-CM

## 2023-03-09 DIAGNOSIS — Z0000 Encounter for general adult medical examination without abnormal findings: Secondary | ICD-10-CM

## 2023-03-09 DIAGNOSIS — K222 Esophageal obstruction: Secondary | ICD-10-CM | POA: Diagnosis not present

## 2023-03-09 DIAGNOSIS — I10 Essential (primary) hypertension: Secondary | ICD-10-CM

## 2023-03-09 DIAGNOSIS — Z91013 Allergy to seafood: Secondary | ICD-10-CM

## 2023-03-09 MED ORDER — PANTOPRAZOLE SODIUM 40 MG PO TBEC: 40.0000 mg | DELAYED_RELEASE_TABLET | Freq: Every day | ORAL | 12 refills | Status: DC

## 2023-03-09 NOTE — Progress Notes (Unsigned)
Subjective:    Patient ID: Robert Suarez, male    DOB: [birth year], 57 y.o.   MRN: [id number]  Robert Suarez is a 57 y.o. male presenting on 03/09/2023 for Annual Exam (Pt concern he could possibly have a seafood allergy. When he eat shrimp he develop a sensation like his throat is closing up. He normally takes acid reflux medication and it seems to help. Pt requesting a referral to Allergist for allergy testing. )   HPI  Here for Annual Physical and Lab Review  Postprandial Diarrhea, functional bowel problem Reports after every meal he has to go to bathroom, BM 3-4 times a day often loose Admits eating junk foods, caffeine, sodas Less water Limited fiber   CHRONIC HTN: Reports elevated BP at home 150-160, checking occasionally Current Meds - Amlodipine 10mg  daily, Olmesartan 40mg  daily Reports good compliance, took meds today. Tolerating well, w/o complaints. Lifestyle: - Diet: admits poor diet Denies CP, dyspnea, HA, edema, dizziness / lightheadedness    Left Toe Pain, episodic Great toe has episodic pain, worse with steel toed boots, says he has flat feet and not using the costly inserts anymore, seems boot rubs against toe some. No other problem or injury Unsure if it is gout   Dysphagia GERD Followed by Dr Servando Snare AGI, last EGD 2021 Has had issue with stricture and narrowing. Improved on PPI Pantoprazole 40mg  daily  Labs reviewed Lipid normal, LDL 100 A1c 5.3, stable normal  ***Additional request  Allergy testing  Health Maintenance:  PSA 1.96 (02/2023) prior 1.91     03/09/2023    8:34 AM 08/19/2022    3:24 PM 04/30/2022    9:55 AM  Depression screen PHQ 2/9  Decreased Interest 0 0 0  Down, Depressed, Hopeless 0 0 0  PHQ - 2 Score 0 0 0  Altered sleeping 0 0 0  Tired, decreased energy 0 0 0  Change in appetite 0 0 0  Feeling bad or failure about yourself  0 0 0  Trouble concentrating 0 0 0  Moving slowly or fidgety/restless 0 0 0  Suicidal thoughts 0 0 0   PHQ-9 Score 0 0 0  Difficult doing work/chores Not difficult at all Not difficult at all Not difficult at all    Past Medical History:  Diagnosis Date   Arthritis    right knee   Family history of adverse reaction to anesthesia    son dx'd with malignant hyperthermia 3 yrs ago (after jaw surgery)   GERD (gastroesophageal reflux disease)    Hypertension    Past Surgical History:  Procedure Laterality Date   COLONOSCOPY WITH PROPOFOL N/A 07/30/2016   Procedure: COLONOSCOPY WITH PROPOFOL;  Surgeon: Wyline Mood, MD;  Location: [facility] ENDOSCOPY;  Service: Endoscopy;  Laterality: N/A;   ESOPHAGOGASTRODUODENOSCOPY (EGD) WITH PROPOFOL N/A 10/23/2019   Procedure: ESOPHAGOGASTRODUODENOSCOPY (EGD) WITH PROPOFOL;  Surgeon: Midge Minium, MD;  Location[facility];  Service: Endoscopy;  Laterality: N/A;   ligament repair Right    NO PAST SURGERIES     Social History   Socioeconomic History   Marital status: Married    Spouse name: Not on file   Number of children: Not on file   Years of education: Not on file   Highest education level: Not on file  Occupational History   Not on file  Tobacco Use   Smoking status: Never   Smokeless tobacco: Never  Vaping Use   Vaping Use: Never used  Substance and Sexual Activity   Alcohol  use: No   Drug use: No   Sexual activity: Yes  Other Topics Concern   Not on file  Social History Narrative   No history of violence/abuse   Social Determinants of Health   Financial Resource Strain: Not on file  Food Insecurity: Not on file  Transportation Needs: Not on file  Physical Activity: Not on file  Stress: Not on file  Social Connections: Not on file  Intimate Partner Violence: Not on file   Family History  Problem Relation Age of Onset   Healthy Mother    Healthy Father    Current Outpatient Medications on File Prior to Visit  Medication Sig   amLODipine (NORVASC) 10 MG tablet Take 1 tablet (10 mg total) by mouth daily.   olmesartan  (BENICAR) 40 MG tablet Take 1 tablet (40 mg total) by mouth daily.   tadalafil (CIALIS) 20 MG tablet Take 1 tablet (20 mg total) by mouth every other day as needed for erectile dysfunction.   [DISCONTINUED] fluticasone (FLONASE) 50 MCG/ACT nasal spray Place 2 sprays into both nostrils daily.   No current facility-administered medications on file prior to visit.    Review of Systems  Constitutional:  Negative for activity change, appetite change, chills, diaphoresis, fatigue and fever.  HENT:  Negative for congestion and hearing loss.   Eyes:  Negative for visual disturbance.  Respiratory:  Negative for cough, chest tightness, shortness of breath and wheezing.   Cardiovascular:  Negative for chest pain, palpitations and leg swelling.  Gastrointestinal:  Negative for abdominal pain, constipation, diarrhea, nausea and vomiting.  Genitourinary:  Negative for dysuria, frequency and hematuria.  Musculoskeletal:  Negative for arthralgias and neck pain.  Skin:  Negative for rash.  Neurological:  Negative for dizziness, weakness, light-headedness, numbness and headaches.  Hematological:  Negative for adenopathy.  Psychiatric/Behavioral:  Negative for behavioral problems, dysphoric mood and sleep disturbance.    Per HPI unless specifically indicated above      Objective:    BP 138/72 (BP Location: Left Arm, Patient Position: Sitting, Cuff Size: Large)   Pulse 81   Temp 99 F (37.2 C) (Oral)   Resp 18   Ht 5\' 10"  (1.778 m)   Wt 207 lb (93.9 kg)   SpO2 97%   BMI 29.70 kg/m   Wt Readings from Last 3 Encounters:  03/09/23 207 lb (93.9 kg)  08/19/22 207 lb (93.9 kg)  04/30/22 202 lb 12.8 oz (92 kg)    Physical Exam Vitals and nursing note reviewed.  Constitutional:      General: He is not in acute distress.    Appearance: He is well-developed. He is not diaphoretic.     Comments: Well-appearing, comfortable, cooperative  HENT:     Head: Normocephalic and atraumatic.  Eyes:      General:        Right eye: No discharge.        Left eye: No discharge.     Conjunctiva/sclera: Conjunctivae normal.     Pupils: Pupils are equal, round, and reactive to light.  Neck:     Thyroid: No thyromegaly.     Vascular: No carotid bruit.  Cardiovascular:     Rate and Rhythm: Normal rate and regular rhythm.     Pulses: Normal pulses.     Heart sounds: Normal heart sounds. No murmur heard. Pulmonary:     Effort: Pulmonary effort is normal. No respiratory distress.     Breath sounds: Normal breath sounds. No wheezing or rales.  Abdominal:     General: Bowel sounds are normal. There is no distension.     Palpations: Abdomen is soft. There is no mass.     Tenderness: There is no abdominal tenderness.  Musculoskeletal:        General: No tenderness. Normal range of motion.     Cervical back: Normal range of motion and neck supple.     Right lower leg: No edema.     Left lower leg: No edema.     Comments: Upper / Lower Extremities: - Normal muscle tone, strength bilateral upper extremities 5/5, lower extremities 5/5  Lymphadenopathy:     Cervical: No cervical adenopathy.  Skin:    General: Skin is warm and dry.     Findings: No erythema or rash.  Neurological:     Mental Status: He is alert and oriented to person, place, and time.     Comments: Distal sensation intact to light touch all extremities  Psychiatric:        Mood and Affect: Mood normal.        Behavior: Behavior normal.        Thought Content: Thought content normal.     Comments: Well groomed, good eye contact, normal speech and thoughts       Results for orders placed or performed in visit on 03/02/23  PSA  Result Value Ref Range   PSA 1.96 < OR = 4.00 ng/mL  Lipid panel  Result Value Ref Range   Cholesterol 172 <200 mg/dL   HDL 52 > OR = 40 mg/dL   Triglycerides 478 <295 mg/dL   LDL Cholesterol (Calc) 100 (H) mg/dL (calc)   Total CHOL/HDL Ratio 3.3 <5.0 (calc)   Non-HDL Cholesterol (Calc) 120 <130  mg/dL (calc)  Hemoglobin A2Z  Result Value Ref Range   Hgb A1c MFr Bld 5.3 <5.7 % of total Hgb   Mean Plasma Glucose 105 mg/dL   eAG (mmol/L) 5.8 mmol/L  CBC with Differential/Platelet  Result Value Ref Range   WBC 6.9 3.8 - 10.8 Thousand/uL   RBC 4.17 (L) 4.20 - 5.80 Million/uL   Hemoglobin 13.8 13.2 - 17.1 g/dL   HCT 30.8 65.7 - 84.6 %   MCV 99.3 80.0 - 100.0 fL   MCH 33.1 (H) 27.0 - 33.0 pg   MCHC 33.3 32.0 - 36.0 g/dL   RDW 96.2 95.2 - 84.1 %   Platelets 176 140 - 400 Thousand/uL   MPV 11.3 7.5 - 12.5 fL   Neutro Abs 4,195 1,500 - 7,800 cells/uL   Lymphs Abs 1,684 850 - 3,900 cells/uL   Absolute Monocytes 883 200 - 950 cells/uL   Eosinophils Absolute 110 15 - 500 cells/uL   Basophils Absolute 28 0 - 200 cells/uL   Neutrophils Relative % 60.8 %   Total Lymphocyte 24.4 %   Monocytes Relative 12.8 %   Eosinophils Relative 1.6 %   Basophils Relative 0.4 %  Comprehensive metabolic panel  Result Value Ref Range   Glucose, Bld 94 65 - 99 mg/dL   BUN 16 7 - 25 mg/dL   Creat 3.24 4.01 - 0.27 mg/dL   BUN/Creatinine Ratio SEE NOTE: 6 - 22 (calc)   Sodium 143 135 - 146 mmol/L   Potassium 4.0 3.5 - 5.3 mmol/L   Chloride 107 98 - 110 mmol/L   CO2 27 20 - 32 mmol/L   Calcium 9.3 8.6 - 10.3 mg/dL   Total Protein 6.0 (L) 6.1 - 8.1 g/dL   Albumin  4.4 3.6 - 5.1 g/dL   Globulin 1.6 (L) 1.9 - 3.7 g/dL (calc)   AG Ratio 2.8 (H) 1.0 - 2.5 (calc)   Total Bilirubin 0.7 0.2 - 1.2 mg/dL   Alkaline phosphatase (APISO) 62 35 - 144 U/L   AST 14 10 - 35 U/L   ALT 15 9 - 46 U/L      Assessment & Plan:   Problem List Items Addressed This Visit     Dysphagia   Relevant Medications   pantoprazole (PROTONIX) 40 MG tablet   Essential hypertension   Other Visit Diagnoses     Annual physical exam    -  Primary   Gastroesophageal reflux disease without esophagitis       Relevant Medications   pantoprazole (PROTONIX) 40 MG tablet   Benign esophageal stricture       Relevant Medications    pantoprazole (PROTONIX) 40 MG tablet   Allergy to shrimp       Relevant Orders   Food Allergy Profile       Updated Health Maintenance information Reviewed recent lab results with patient Encouraged improvement to lifestyle with diet and exercise Goal of weight loss   Meds ordered this encounter  Medications   pantoprazole (PROTONIX) 40 MG tablet    Sig: Take 1 tablet (40 mg total) by mouth daily before breakfast.    Dispense:  30 tablet    Refill:  12      Follow up plan: Return in about 1 year (around 03/08/2024) for 1 year fasting lab only then 1 week later Annual Physical.  ***Future labs  ***7/16 AM apt food allergy + R Knee X-ray  Saralyn Pilar, DO [facility] 03/09/2023, 9:00 AM

## 2023-03-09 NOTE — Patient Instructions (Addendum)
Thank you for coming to the office today.  Labs look good A1c sugar and cholesterol are perfect.  Keep on BP medication  Refill stomach acid Pantoprazole.  Ordered Lab for Food Allergy Profile including:  Includes IgE allergy testing for: Almond (f20) Cashew Nut (f202) Codfish (f3) Cow's Milk (f2) Egg White (f1) Hazelnut (f17) Peanut (f13) Salmon (f41) Scallop (f338) Sesame Seed (f10) Shrimp (f24) Soybean (f14) Tuna (f40) Walnut (f256) Wheat (f4)  If there is a significant allergy, we can let you know and refer to Allergist.    DUE for FASTING BLOOD WORK (no food or drink after midnight before the lab appointment, only water or coffee without cream/sugar on the morning of)  SCHEDULE "Lab Only" visit in the morning at the clinic for lab draw in 1 YEAR  - Make sure Lab Only appointment is at about 1 week before your next appointment, so that results will be available  For Lab Results, once available within 2-3 days of blood draw, you can can log in to MyChart online to view your results and a brief explanation. Also, we can discuss results at next follow-up visit.   Please schedule a Follow-up Appointment to: Return in about 1 year (around 03/08/2024) for 1 year fasting lab only then 1 week later Annual Physical.  If you have any other questions or concerns, please feel free to call the office or send a message through MyChart. You may also schedule an earlier appointment if necessary.  Additionally, you may be receiving a survey about your experience at our office within a few days to 1 week by e-mail or mail. We value your feedback.  Saralyn Pilar, DO [facility], [place]

## 2023-03-10 ENCOUNTER — Other Ambulatory Visit: Payer: Self-pay | Admitting: Family Medicine

## 2023-03-10 DIAGNOSIS — G8929 Other chronic pain: Secondary | ICD-10-CM

## 2023-03-10 DIAGNOSIS — M1711 Unilateral primary osteoarthritis, right knee: Secondary | ICD-10-CM

## 2023-03-30 ENCOUNTER — Other Ambulatory Visit: Payer: BC Managed Care – PPO

## 2023-03-30 DIAGNOSIS — Z91013 Allergy to seafood: Secondary | ICD-10-CM | POA: Diagnosis not present

## 2023-04-01 LAB — FOOD ALLERGY PROFILE
Allergen, Salmon, f41: <0.1 kU/L
Almonds: <0.1 kU/L
CLASS: 0
CLASS: 0
CLASS: 0
CLASS: 0
CLASS: 0
CLASS: 0
CLASS: 0
CLASS: 0
CLASS: 0
CLASS: 0
CLASS: 0
Cashew IgE: <0.1 kU/L
Class: 0
Class: 0
Class: 0
Class: 0
Egg White IgE: <0.1 kU/L
Fish Cod: <0.1 kU/L
Hazelnut: <0.1 kU/L
Milk IgE: <0.1 kU/L
Peanut IgE: <0.1 kU/L
Scallop IgE: <0.1 kU/L
Sesame Seed f10: <0.1 kU/L
Shrimp IgE: <0.1 kU/L
Soybean IgE: <0.1 kU/L
Tuna IgE: <0.1 kU/L
Walnut: <0.1 kU/L
Wheat IgE: <0.1 kU/L

## 2023-04-01 LAB — EXTRA SPECIMEN

## 2023-04-01 LAB — INTERPRETATION:

## 2023-05-17 ENCOUNTER — Other Ambulatory Visit: Payer: Self-pay | Admitting: Family Medicine

## 2023-05-17 DIAGNOSIS — N529 Male erectile dysfunction, unspecified: Secondary | ICD-10-CM

## 2023-05-28 ENCOUNTER — Other Ambulatory Visit: Payer: Self-pay | Admitting: Family Medicine

## 2023-05-28 DIAGNOSIS — N529 Male erectile dysfunction, unspecified: Secondary | ICD-10-CM

## 2023-05-31 NOTE — Telephone Encounter (Signed)
Refused Cialis because this is a duplicate.

## 2023-07-14 ENCOUNTER — Other Ambulatory Visit: Payer: Self-pay | Admitting: Family Medicine

## 2023-07-14 DIAGNOSIS — K222 Esophageal obstruction: Secondary | ICD-10-CM

## 2023-07-14 DIAGNOSIS — K219 Gastro-esophageal reflux disease without esophagitis: Secondary | ICD-10-CM

## 2023-07-14 DIAGNOSIS — R1319 Other dysphagia: Secondary | ICD-10-CM

## 2023-07-14 MED ORDER — PANTOPRAZOLE SODIUM 40 MG PO TBEC
40.0000 mg | DELAYED_RELEASE_TABLET | Freq: Every day | ORAL | 0 refills | Status: DC
Start: 2023-07-14 — End: 2023-07-26

## 2023-07-14 NOTE — Telephone Encounter (Signed)
Called pt to inform him of Lisinopril was dc'd back in 2022 and verify which HTN med he is needing for refill. LVMTCB to discuss.

## 2023-07-14 NOTE — Telephone Encounter (Signed)
Sending short supply until pt gets mail order.   Requested Prescriptions  Pending Prescriptions Disp Refills   pantoprazole (PROTONIX) 40 MG tablet 14 tablet 0    Sig: Take 1 tablet (40 mg total) by mouth daily before breakfast.     Gastroenterology: Proton Pump Inhibitors Passed - 07/14/2023  5:48 PM      Passed - Valid encounter within last 12 months    Recent Outpatient Visits           4 months ago Annual physical exam   [place] [facility] Smitty Cords, DO   10 months ago Viral URI with cough   [facility] Smitty Cords, DO   1 year ago Essential hypertension   [place] [facility] Smitty Cords, DO   1 year ago Eczema, unspecified type   [facility] Smitty Cords, DO   1 year ago Essential hypertension    [facility] [place], [place], [place]

## 2023-07-14 NOTE — Telephone Encounter (Signed)
Medication Refill - Medication:  pantoprazole (PROTONIX) 40 MG tablet  Linsinopril 10 MG  *Patient needs a 14 day supply of each until his mail order kicks in.    Has the patient contacted their pharmacy? Yes, advised to contact PCP for the 14 day supply  Preferred Pharmacy (with phone number or street name):  [id number] [place], [place] - [address] AT [facility] OF [address] [facility]  Phone: [phone] Fax: [phone]  Has the patient been seen for an appointment in the last year OR does the patient have an upcoming appointment? Yes

## 2023-07-15 ENCOUNTER — Other Ambulatory Visit: Payer: Self-pay

## 2023-07-15 DIAGNOSIS — I10 Essential (primary) hypertension: Secondary | ICD-10-CM

## 2023-07-15 MED ORDER — OLMESARTAN MEDOXOMIL 40 MG PO TABS
40.0000 mg | ORAL_TABLET | Freq: Every day | ORAL | 3 refills | Status: AC
Start: 2023-07-15 — End: ?

## 2023-07-15 NOTE — Telephone Encounter (Signed)
[facility] to refill Olmesartan and Lisinopril 10 mg.  Olemsartan pended for approval. It appears that Lisinopril was previously discontinued. Please confirm and refill if appropriate.

## 2023-07-15 NOTE — Telephone Encounter (Signed)
Correct he is only on Olmesartan 40mg  daily. Not Lisinopril.  We have never sent any rx to [facility] before for this patient as far as I can see.  He normally uses [facility] in [place].  Can you try to follow-up with patient to clarify which pharmacy? And 30 day vs 90 day order?  The original request was for 30 day sent to [facility] which would not work either should be 90 day  Thank you!  Saralyn Pilar, DO [facility] 07/15/2023, 3:11 PM

## 2023-07-26 ENCOUNTER — Other Ambulatory Visit: Payer: Self-pay | Admitting: Family Medicine

## 2023-07-26 DIAGNOSIS — I10 Essential (primary) hypertension: Secondary | ICD-10-CM

## 2023-07-26 DIAGNOSIS — R1319 Other dysphagia: Secondary | ICD-10-CM

## 2023-07-26 DIAGNOSIS — K222 Esophageal obstruction: Secondary | ICD-10-CM

## 2023-07-26 DIAGNOSIS — K219 Gastro-esophageal reflux disease without esophagitis: Secondary | ICD-10-CM

## 2023-07-26 NOTE — Telephone Encounter (Signed)
Medication Refill -  Most Recent Primary Care Visit:  Provider: [facility]  Department: [facility]  Visit Type: LAB  Date: 03/30/2023  Medication: Lisinopril 10 mg, Pantoprazole 10 mg ,   [facility]  the insurance will not cover local pharmacy  Has the patient contacted their pharmacy? Yes (Agent: If no, request that the patient contact the pharmacy for the refill. If patient does not wish to contact the pharmacy document the reason why and proceed with request.) (Agent: If yes, when and what did the pharmacy advise?)  Is this the correct pharmacy for this prescription? Yes If no, delete pharmacy and type the correct one.  This is the patient's preferred pharmacy:   Has the prescription been filled recently? Yes  Is the patient out of the medication? No  Has the patient been seen for an appointment in the last year OR does the patient have an upcoming appointment? Yes  Can we respond through MyChart? No  Agent: Please be advised that Rx refills may take up to 3 business days. We ask that you follow-up with your pharmacy.

## 2023-07-27 MED ORDER — PANTOPRAZOLE SODIUM 40 MG PO TBEC
40.0000 mg | DELAYED_RELEASE_TABLET | Freq: Every day | ORAL | 1 refills | Status: DC
Start: 2023-07-27 — End: 2024-01-05

## 2023-07-27 NOTE — Telephone Encounter (Signed)
Requested Prescriptions  Pending Prescriptions Disp Refills   pantoprazole (PROTONIX) 40 MG tablet 90 tablet 1    Sig: Take 1 tablet (40 mg total) by mouth daily before breakfast.     Gastroenterology: Proton Pump Inhibitors Passed - 07/26/2023  3:36 PM      Passed - Valid encounter within last 12 months    Recent Outpatient Visits           4 months ago Annual physical exam   [facility] Smitty Cords, DO   11 months ago Viral URI with cough   [facility] Smit[facility] Cords, DO   1 year ago Essential hypertension   [place] [facility] Smit[facility] Cords, DO   1 year ago Eczema, unspecified type   [facility] Smitty Cords, DO   1 year ago Essential hypertension   [place] [facility] [place], Netta Neat, DO              Refused Prescriptions Disp Refills   lisinopril (ZESTRIL) 20 MG tablet 90 tablet 3    Sig: Take 1 tablet (20 mg total) by mouth daily.     Cardiovascular:  ACE Inhibitors Passed - 07/26/2023  3:36 PM      Passed - Cr in normal range and within 180 days    Creat  Date Value Ref Range Status  03/02/2023 1.06 0.70 - 1.30 mg/dL Final         Passed - K in normal range and within 180 days    Potassium  Date Value Ref Range Status  03/02/2023 4.0 3.5 - 5.3 mmol/L Final         Passed - Patient is not pregnant      Passed - Last BP in normal range    BP Readings from Last 1 Encounters:  03/09/23 138/72         Passed - Valid encounter within last 6 months    Recent Outpatient Visits           4 months ago Annual physical exam   [place] [facility] Smitty Cords, DO   11 months ago Viral URI with cough   [place] [facility] Smitty Cords, DO   1 year ago Essential hypertension   [place] [facility]  Smitty Cords, DO   1 year ago Eczema, unspecified type   [facility] Smitty Cords, DO   1 year ago Essential hypertension   [place] [facility] [place], [place], [place]

## 2023-07-30 ENCOUNTER — Telehealth: Payer: Self-pay

## 2023-07-30 NOTE — Telephone Encounter (Signed)
Hold off on refill at this time. It appears according to his last note with Dr. Kirtland Bouchard that he is on amlodipine and olmesartan and this is what is reflected on the current med list.

## 2023-07-30 NOTE — Telephone Encounter (Signed)
Received refill request for patient from [facility] for Lisinopril tabs 10 mg. I did not see recent prescription on file for patient. Okay to refill? Thanks!

## 2023-08-02 NOTE — Telephone Encounter (Signed)
Correct. He is on Olmesartan ARB instead of Lisinopril ACEi.  Also on Amlodipine  Saralyn Pilar, DO [facility] 08/02/2023, 12:05 PM

## 2023-08-07 ENCOUNTER — Other Ambulatory Visit: Payer: Self-pay | Admitting: Family Medicine

## 2023-08-07 DIAGNOSIS — N529 Male erectile dysfunction, unspecified: Secondary | ICD-10-CM

## 2023-08-09 NOTE — Telephone Encounter (Signed)
Requested Prescriptions  Pending Prescriptions Disp Refills   tadalafil (CIALIS) 20 MG tablet [Pharmacy Med Name: Tadalafil 20 MG Oral Tablet] 30 tablet 2    Sig: TAKE 1 TABLET BY MOUTH EVERY OTHER DAY AS NEEDED FOR ERECTILE DYSFUNCTION     Urology: Erectile Dysfunction Agents Passed - 08/07/2023  7:49 AM      Passed - AST in normal range and within 360 days    AST  Date Value Ref Range Status  03/02/2023 14 10 - 35 U/L Final         Passed - ALT in normal range and within 360 days    ALT  Date Value Ref Range Status  03/02/2023 15 9 - 46 U/L Final         Passed - Last BP in normal range    BP Readings from Last 1 Encounters:  03/09/23 138/72         Passed - Valid encounter within last 12 months    Recent Outpatient Visits           5 months ago Annual physical exam   [place] [facility] Smitty Cords, DO   11 months ago Viral URI with cough   [facility] Smitty Cords, DO   1 year ago Essential hypertension   [place] Smitty Cords, DO   1 year ago Eczema, unspecified type   [facility] Smitty Cords, DO   1 year ago Essential hypertension   [place] [facility] [place], [place], [place]

## 2023-11-05 ENCOUNTER — Encounter: Payer: Self-pay | Admitting: Family Medicine

## 2023-11-05 ENCOUNTER — Ambulatory Visit: Payer: BC Managed Care – PPO | Admitting: Family Medicine

## 2023-11-05 VITALS — BP 128/84 | HR 86 | Ht 70.0 in | Wt 207.0 lb

## 2023-11-05 DIAGNOSIS — N529 Male erectile dysfunction, unspecified: Secondary | ICD-10-CM

## 2023-11-05 DIAGNOSIS — R350 Frequency of micturition: Secondary | ICD-10-CM | POA: Diagnosis not present

## 2023-11-05 DIAGNOSIS — J0110 Acute frontal sinusitis, unspecified: Secondary | ICD-10-CM

## 2023-11-05 DIAGNOSIS — I10 Essential (primary) hypertension: Secondary | ICD-10-CM | POA: Diagnosis not present

## 2023-11-05 LAB — POCT URINALYSIS DIPSTICK
Bilirubin, UA: NEGATIVE
Blood, UA: NEGATIVE
Glucose, UA: NEGATIVE
Leukocytes, UA: NEGATIVE
Nitrite, UA: NEGATIVE
Odor: POSITIVE
Protein, UA: NEGATIVE
Spec Grav, UA: 1.025 (ref 1.010–1.025)
Urobilinogen, UA: 0.2 U/dL
pH, UA: 6 (ref 5.0–8.0)

## 2023-11-05 MED ORDER — AMOXICILLIN-POT CLAVULANATE 875-125 MG PO TABS: 1.0000 | ORAL_TABLET | Freq: Two times a day (BID) | ORAL | 0 refills | Status: DC

## 2023-11-05 MED ORDER — FLUTICASONE PROPIONATE 50 MCG/ACT NA SUSP: 2.0000 | Freq: Every day | NASAL | 3 refills | Status: AC

## 2023-11-05 MED ORDER — TADALAFIL 20 MG PO TABS: ORAL_TABLET | ORAL | 2 refills | Status: DC

## 2023-11-05 NOTE — Patient Instructions (Addendum)
 Thank you for coming to the office today.  Start nasal steroid Flonase 2 sprays in each nostril daily for 4-6 weeks, may repeat course seasonally or as needed  If not improved within 48 hours, take Augmentin antibiotic.  We can consider referral to Urologist or GI in future for other symptoms. Let me know  Please schedule a Follow-up Appointment to: Return if symptoms worsen or fail to improve.  If you have any other questions or concerns, please feel free to call the office or send a message through MyChart. You may also schedule an earlier appointment if necessary.  Additionally, you may be receiving a survey about your experience at our office within a few days to 1 week by e-mail or mail. We value your feedback.  Saralyn Pilar, DO [facility], [place]

## 2023-11-05 NOTE — Progress Notes (Signed)
 Subjective:    Patient ID: Robert Suarez, male    DOB: [birth year], 58 y.o.   MRN: [id number]  Robert Suarez is a 58 y.o. male presenting on 11/05/2023 for Sinusitis  Patient presents for a same day appointment.   HPI  Discussed the use of AI scribe software for clinical note transcription with the patient, who gave verbal consent to proceed.  History of Present Illness   Robert Suarez is a 58 year old male who presents with sinus congestion and urinary frequency.  He has been experiencing sinus congestion for the past three days, characterized by a stuffed head, sneezing, and a runny nose. No cough, fever, or chills. He has been using Mucinex, which has provided some relief, particularly for his eyes. He notes that one nostril seems to have less airflow than the other. He has not used any over-the-counter nasal sprays.  He reports increased urinary frequency. No burning sensation during urination. He has observed bubbles in his urine but a recent urine test showed no significant abnormalities, including no protein, sugar, or blood. He works night shifts, which he believes may contribute to his increased urinary frequency during the day.  He mentions experiencing rashes on his leg, which have now appeared on his stomach. He describes them as dry, flaky, and irritated, similar to eczema. He still has some cream from a previous prescription that he uses for these rashes.  He has a history of high blood pressure, for which he takes medication. He recently took his blood pressure medication about 30 to 45 minutes before the visit. He typically sees his blood pressure reduced to around 120/80 mmHg with medication.          03/09/2023    8:34 AM 08/19/2022    3:24 PM 04/30/2022    9:55 AM  Depression screen PHQ 2/9  Decreased Interest 0 0 0  Down, Depressed, Hopeless 0 0 0  PHQ - 2 Score 0 0 0  Altered sleeping 0 0 0  Tired, decreased energy 0 0 0  Change in appetite 0 0 0  Feeling bad or  failure about yourself  0 0 0  Trouble concentrating 0 0 0  Moving slowly or fidgety/restless 0 0 0  Suicidal thoughts 0 0 0  PHQ-9 Score 0 0 0  Difficult doing work/chores Not difficult at all Not difficult at all Not difficult at all       03/09/2023    8:34 AM 08/19/2022    3:24 PM 04/30/2022    9:55 AM 07/18/2021   10:02 AM  GAD 7 : Generalized Anxiety Score  Nervous, Anxious, on Edge 0 0 0 0  Control/stop worrying 0 0 0 0  Worry too much - different things 0 0 0 0  Trouble relaxing 0 0 0 0  Restless 0 0 0 0  Easily annoyed or irritable 0 0 0 0  Afraid - awful might happen 0 0 0 0  Total GAD 7 Score 0 0 0 0  Anxiety Difficulty Not difficult at all Not difficult at all Not difficult at all Not difficult at all    Social History   Tobacco Use   Smoking status: Never   Smokeless tobacco: Never  Vaping Use   Vaping status: Never Used  Substance Use Topics   Alcohol use: No   Drug use: No    Review of Systems Per HPI unless specifically indicated above     Objective:    BP 128/84 (BP Location:  Left Arm, Cuff Size: Normal)   Pulse 86   Ht 5\' 10"  (1.778 m)   Wt 207 lb (93.9 kg)   SpO2 97%   BMI 29.70 kg/m   Wt Readings from Last 3 Encounters:  11/05/23 207 lb (93.9 kg)  03/09/23 207 lb (93.9 kg)  08/19/22 207 lb (93.9 kg)    Physical Exam Vitals and nursing note reviewed.  Constitutional:      General: He is not in acute distress.    Appearance: He is well-developed. He is not diaphoretic.     Comments: Well-appearing, comfortable, cooperative  HENT:     Head: Normocephalic and atraumatic.     Right Ear: Tympanic membrane, ear canal and external ear normal. There is no impacted cerumen.     Left Ear: Tympanic membrane, ear canal and external ear normal. There is no impacted cerumen.     Nose:     Comments: Turbinate edema bilateral Eyes:     General:        Right eye: No discharge.        Left eye: No discharge.     Conjunctiva/sclera: Conjunctivae  normal.  Neck:     Thyroid: No thyromegaly.  Cardiovascular:     Rate and Rhythm: Normal rate and regular rhythm.     Pulses: Normal pulses.     Heart sounds: Normal heart sounds. No murmur heard. Pulmonary:     Effort: Pulmonary effort is normal. No respiratory distress.     Breath sounds: Normal breath sounds. No wheezing or rales.  Musculoskeletal:        General: Normal range of motion.     Cervical back: Normal range of motion and neck supple.  Lymphadenopathy:     Cervical: No cervical adenopathy.  Skin:    General: Skin is warm and dry.     Findings: No erythema or rash.  Neurological:     Mental Status: He is alert and oriented to person, place, and time. Mental status is at baseline.  Psychiatric:        Behavior: Behavior normal.     Comments: Well groomed, good eye contact, normal speech and thoughts     Results for orders placed or performed in visit on 11/05/23  POCT Urinalysis Dipstick   Collection Time: 11/05/23 11:26 AM  Result Value Ref Range   Color, UA yellow    Clarity, UA cloudy    Glucose, UA Negative Negative   Bilirubin, UA negative    Ketones, UA trace    Spec Grav, UA 1.025 1.010 - 1.025   Blood, UA negative    pH, UA 6.0 5.0 - 8.0   Protein, UA Negative Negative   Urobilinogen, UA 0.2 0.2 or 1.0 E.U./dL   Nitrite, UA negative    Leukocytes, UA Negative Negative   Appearance     Odor positive       Assessment & Plan:   Problem List Items Addressed This Visit     Essential hypertension   Relevant Medications   tadalafil (CIALIS) 20 MG tablet   Other Visit Diagnoses       Acute non-recurrent frontal sinusitis    -  Primary   Relevant Medications   amoxicillin-clavulanate (AUGMENTIN) 875-125 MG tablet   fluticasone (FLONASE) 50 MCG/ACT nasal spray     Urinary frequency       Relevant Orders   POCT Urinalysis Dipstick (Completed)     Erectile dysfunction, unspecified erectile dysfunction type  Relevant Medications   tadalafil  (CIALIS) 20 MG tablet        Upper Respiratory Infection Symptoms of congestion, sneezing, and body aches for the past three days. No fever, cough, or shortness of breath. Likely viral etiology.  Notable turbinate edema and sinus congestion -Continue Mucinex as needed for symptom relief. -Prescribe Flonase nasal spray, two sprays in each nostril daily, to reduce sinus swelling. -Provide backup prescription for Amoxicillin in case symptoms do not improve by the end of the weekend.  Urinary Frequency Increased urinary frequency with associated leg pain. Urinalysis normal, no signs of infection or significant proteinuria. Possible link to bowel issues or sleep schedule due to shift work. -Consider referral to Urologist if symptoms persist or worsen.  Hypertension Elevated blood pressure at visit, but patient had recently taken antihypertensive medication. -Continue current antihypertensive regimen. -Monitor blood pressure.  Eczema Recurrent rashes on legs and now abdomen, likely eczema exacerbated by winter weather. -Continue use of prescribed cream as needed for symptom relief.  Medication Refill Cialis -Send refill prescription to [facility].  General Health Maintenance / Followup Plans -Consider addressing digestive issues in future visit, possibly with referral to Gastroenterologist. -Check-in post-visit if sinus symptoms do not improve or urinary symptoms worsen.  Note provided today        Orders Placed This Encounter  Procedures   POCT Urinalysis Dipstick    Meds ordered this encounter  Medications   amoxicillin-clavulanate (AUGMENTIN) 875-125 MG tablet    Sig: Take 1 tablet by mouth 2 (two) times daily.    Dispense:  20 tablet    Refill:  0   fluticasone (FLONASE) 50 MCG/ACT nasal spray    Sig: Place 2 sprays into both nostrils daily. Use for 4-6 weeks then stop and use seasonally or as needed.    Dispense:  16 g    Refill:  3   tadalafil (CIALIS) 20 MG tablet     Sig: TAKE 1 TABLET BY MOUTH EVERY OTHER DAY AS NEEDED FOR ERECTILE DYSFUNCTION    Dispense:  30 tablet    Refill:  2    Follow up plan: Return if symptoms worsen or fail to improve.   Saralyn Pilar, DO [facility] 11/05/2023, 11:52 AM

## 2024-01-05 ENCOUNTER — Other Ambulatory Visit: Payer: Self-pay | Admitting: Family Medicine

## 2024-01-05 DIAGNOSIS — K222 Esophageal obstruction: Secondary | ICD-10-CM

## 2024-01-05 DIAGNOSIS — R1319 Other dysphagia: Secondary | ICD-10-CM

## 2024-01-05 DIAGNOSIS — K219 Gastro-esophageal reflux disease without esophagitis: Secondary | ICD-10-CM

## 2024-01-05 NOTE — Telephone Encounter (Signed)
 Requested Prescriptions  Pending Prescriptions Disp Refills   pantoprazole  (PROTONIX ) 40 MG tablet [Pharmacy Med Name: PANTOPRAZOLE  SODIUM DR TABS 40MG ] 90 tablet 1    Sig: TAKE 1 TABLET DAILY BEFORE BREAKFAST     Gastroenterology: Proton Pump Inhibitors Failed - 01/05/2024  2:17 PM      Failed - Valid encounter within last 12 months    Recent Outpatient Visits           2 months ago Acute non-recurrent frontal sinusitis   [facility] [place], [place], [place]

## 2024-02-18 ENCOUNTER — Ambulatory Visit: Payer: Self-pay

## 2024-02-18 NOTE — Telephone Encounter (Signed)
    FYI Only or Action Required?: FYI only for provider  Patient was last seen in primary care on 11/05/2023 by Raina Bunting, DO. Called Nurse Triage reporting Leg Injury. Symptoms began a week ago. Interventions attempted: Nothing. Symptoms are: gradually improving.  Triage Disposition: No disposition on file.  Patient/caregiver understands and will follow disposition?:    Copied from CRM [id number]. Topic: Appointments - Appointment Scheduling >> Feb 18, 2024 12:14 PM Geneva B wrote: Patient/patient representative is calling to schedule an appointment. Refer to attachments for appointment information.  Right leg swelling dropped something on it at work Reason for Disposition  [1] MODERATE leg swelling (e.g., swelling extends up to knees) AND [2] new-onset or worsening  Answer Assessment - Initial Assessment Questions 1. ONSET: "When did the swelling start?" (e.g., minutes, hours, days)     Last Thursday dropped axil on leg, states it broke the skin a bit and bleeding was easily stopped.  2. LOCATION: "What part of the leg is swollen?"  "Are both legs swollen or just one leg?"     Right leg swelling has not gone down 3. SEVERITY: "How bad is the swelling?" (e.g., localized; mild, moderate, severe)   - Localized: Small area of swelling localized to one leg.   - MILD pedal edema: Swelling limited to foot and ankle, pitting edema < 1/4 inch (6 mm) deep, rest and elevation eliminate most or all swelling.   - MODERATE edema: Swelling of lower leg to knee, pitting edema > 1/4 inch (6 mm) deep, rest and elevation only partially reduce swelling.   - SEVERE edema: Swelling extends above knee, facial or hand swelling present.      States only hurts when he pushes on it. Moderate swelling 4. REDNESS: "Does the swelling look red or infected?"     red 5. PAIN: "Is the swelling painful to touch?" If Yes, ask: "How painful is it?"   (Scale 1-10; mild, moderate or severe)     denies 6.  FEVER: "Do you have a fever?" If Yes, ask: "What is it, how was it measured, and when did it start?"      no 7. CAUSE: "What do you think is causing the leg swelling?"     injury 8. MEDICAL HISTORY: "Do you have a history of blood clots (e.g., DVT), cancer, heart failure, kidney disease, or liver failure?"     no 9. RECURRENT SYMPTOM: "Have you had leg swelling before?" If Yes, ask: "When was the last time?" "What happened that time?"     no 10. OTHER SYMPTOMS: "Do you have any other symptoms?" (e.g., chest pain, difficulty breathing)       no 11. PREGNANCY: "Is there any chance you are pregnant?" "When was your last menstrual period?"       no  Protocols used: Leg Swelling and Edema-A-AH

## 2024-02-18 NOTE — Telephone Encounter (Signed)
 Will discuss at upcoming appt.

## 2024-02-21 ENCOUNTER — Ambulatory Visit (INDEPENDENT_AMBULATORY_CARE_PROVIDER_SITE_OTHER): Admitting: Internal Medicine

## 2024-02-21 ENCOUNTER — Encounter: Payer: Self-pay | Admitting: Internal Medicine

## 2024-02-21 VITALS — BP 150/90 | Ht 70.0 in | Wt 204.6 lb

## 2024-02-21 DIAGNOSIS — S8011XA Contusion of right lower leg, initial encounter: Secondary | ICD-10-CM | POA: Diagnosis not present

## 2024-02-21 DIAGNOSIS — S81831A Puncture wound without foreign body, right lower leg, initial encounter: Secondary | ICD-10-CM | POA: Diagnosis not present

## 2024-02-21 DIAGNOSIS — Z23 Encounter for immunization: Secondary | ICD-10-CM | POA: Diagnosis not present

## 2024-02-21 MED ORDER — DOXYCYCLINE HYCLATE 100 MG PO TABS: 100.0000 mg | ORAL_TABLET | Freq: Two times a day (BID) | ORAL | 0 refills | Status: DC

## 2024-02-21 NOTE — Patient Instructions (Signed)
 Hematoma A hematoma is a collection of blood. A hematoma can happen: Under the skin. In an organ. In a body space. In a joint space. In other tissues. The blood can thicken (clot) to form a lump that you can see and feel. The lump is often hard and may become sore and tender. The lump can be very small or very big. Most hematomas get better in a few days to weeks. However, some of these may be serious and need medical care. What are the causes? This condition is caused by: An injury. Blood that leaks under the skin. Problems from surgeries. Medical conditions that cause bleeding or bruising. What increases the risk? You are more likely to develop this condition if: You are an older adult. You use medicines that thin your blood. You use NSAIDs, such as ibuprofen, often for pain. You play contact sports. What are the signs or symptoms? Symptoms depend on where the hematoma is in your body. If the hematoma is under the skin, there is: A firm lump on the body. Pain and tenderness in the area. Bruising. The skin above the lump may be blue, dark blue, purple-red, or yellowish. If the hematoma is deep in the tissues or body spaces, there may be: Blood in the stomach. This may cause pain in the belly (abdomen), weakness, passing out (fainting), and shortness of breath. Blood in the head. This may cause a headache, weakness, trouble speaking or understanding speech, or passing out. How is this treated? Treatment depends on the cause, size, and location of the hematoma. Treatment may include: Doing nothing. Many hematomas go away on their own without treatment. Surgery or close monitoring. This may be needed for large hematomas or hematomas that affect the body's organs. Medicines. These may be given if a medical condition caused the hematoma. Follow these instructions at home: Managing pain, stiffness, and swelling  If told, put ice on the injured area. To do this: Put ice in a plastic  bag. Place a towel between your skin and the bag. Leave the ice on for 20 minutes, 2-3 times a day for the first two days. If your skin turns bright red, take off the ice right away to prevent skin damage. The risk of skin damage is higher if you cannot feel pain, heat, or cold. If told, put heat on the injured area. Do this as often as told by your doctor. Use the heat source that your doctor recommends, such as a moist heat pack or a heating pad. Place a towel between your skin and the heat source. Leave the heat on for 20-30 minutes. If your skin turns bright red, take off the heat right away to prevent burns. The risk of burns is higher if you cannot feel pain, heat, or cold. Raise the injured area above the level of your heart while you are sitting or lying down. Wrap the affected area with an elastic bandage, if told by your doctor. Do not wrap the bandage too tight. If your hematoma is on a leg or foot and is painful, your doctor may give you crutches. Use them as told by your doctor. General instructions Take over-the-counter and prescription medicines only as told by your doctor. Keep all follow-up visits. Your doctor may want to see how your hematoma is healing with treatment. Contact a doctor if: You have a fever. The swelling or bruising gets worse. You start to get more hematomas. Your pain gets worse. Your pain is not getting better  with medicine. The skin over the hematoma breaks or starts to bleed. Get help right away if: Your hematoma is in your chest or belly and you: Pass out. Feel weak. Become short of breath. You have a hematoma on your scalp that is caused by a fall or injury, and you: Have a headache that gets worse. Have trouble speaking or understanding speech. Become less alert or you pass out. These symptoms may be an emergency. Get help right away. Call 911. Do not wait to see if the symptoms will go away. Do not drive yourself to the hospital This  information is not intended to replace advice given to you by your health care provider. Make sure you discuss any questions you have with your health care provider. Document Revised: 02/23/2022 Document Reviewed: 02/23/2022 Elsevier Patient Education  2024 [employer].

## 2024-02-21 NOTE — Progress Notes (Signed)
 Subjective:    Patient ID: Robert Suarez, male    DOB: April 07, 1966, 58 y.o.   MRN: [id number]  HPI  Discussed the use of AI scribe software for clinical note transcription with the patient, who gave verbal consent to proceed.  Robert Suarez is a 58 year old male who presents with a leg injury from a metal object puncture.  Two weeks ago, he dropped a metal part from his job, specifically part of the axle, onto his leg, causing a puncture wound. The wound was initially not deep and bled for about two minutes before he managed to stop the bleeding. He did not pay much attention to it at the time.  After returning home, he noticed swelling in the area, which became hard underneath. The swelling was accompanied by a blue discoloration with bruising noted below his ankle. The wound has not drained since the initial bleeding stopped. He reports that the area was 'flaming red' about a week ago, but it has since improved. He did not apply peroxide or neosporin, only washing it with soap and water.       Review of Systems   Past Medical History:  Diagnosis Date   Arthritis    right knee   Family history of adverse reaction to anesthesia    son dx'd with malignant hyperthermia 3 yrs ago (after jaw surgery)   GERD (gastroesophageal reflux disease)    Hypertension     Current Outpatient Medications  Medication Sig Dispense Refill   amLODipine  (NORVASC ) 10 MG tablet Take 1 tablet (10 mg total) by mouth daily. 30 tablet 12   amoxicillin -clavulanate (AUGMENTIN ) 875-125 MG tablet Take 1 tablet by mouth 2 (two) times daily. 20 tablet 0   fluticasone  (FLONASE ) 50 MCG/ACT nasal spray Place 2 sprays into both nostrils daily. Use for 4-6 weeks then stop and use seasonally or as needed. 16 g 3   olmesartan  (BENICAR ) 40 MG tablet Take 1 tablet (40 mg total) by mouth daily. 90 tablet 3   pantoprazole  (PROTONIX ) 40 MG tablet TAKE 1 TABLET DAILY BEFORE BREAKFAST 90 tablet 1   tadalafil  (CIALIS ) 20 MG  tablet TAKE 1 TABLET BY MOUTH EVERY OTHER DAY AS NEEDED FOR ERECTILE DYSFUNCTION 30 tablet 2   No current facility-administered medications for this visit.    No Known Allergies  Family History  Problem Relation Age of Onset   Healthy Mother    Healthy Father     Social History   Socioeconomic History   Marital status: Married    Spouse name: Not on file   Number of children: Not on file   Years of education: Not on file   Highest education level: Not on file  Occupational History   Not on file  Tobacco Use   Smoking status: Never   Smokeless tobacco: Never  Vaping Use   Vaping status: Never Used  Substance and Sexual Activity   Alcohol use: No   Drug use: No   Sexual activity: Yes  Other Topics Concern   Not on file  Social History Narrative   No history of violence/abuse   Social Drivers of Corporate investment banker Strain: Not on file  Food Insecurity: Not on file  Transportation Needs: Not on file  Physical Activity: Not on file  Stress: Not on file  Social Connections: Not on file  Intimate Partner Violence: Not on file     Constitutional: Denies fever, malaise, fatigue, headache or abrupt weight changes.  Respiratory: Denies  difficulty breathing, shortness of breath, cough or sputum production.   Cardiovascular: Denies chest pain, chest tightness, palpitations or swelling in the hands or feet.  Musculoskeletal: Denies decrease in range of motion, difficulty with gait, muscle pain or joint pain and swelling.  Skin: Patient reports healing wound to RLE with persistent swelling and bruising.  Denies redness, rashes, lesions.  Neurological: Denies numbness, tingling, weakness or problems with balance and coordination.    No other specific complaints in a complete review of systems (except as listed in HPI above).      Objective:   Physical Exam BP (!) 150/90   Ht 5\' 10"  (1.778 m)   Wt 204 lb 9.6 oz (92.8 kg)   BMI 29.36 kg/m   Wt Readings from  Last 3 Encounters:  11/05/23 207 lb (93.9 kg)  03/09/23 207 lb (93.9 kg)  08/19/22 207 lb (93.9 kg)    General: Appears his stated age, overweight, in NAD. Skin: 2 cm healing skin tear/puncture wound noted to RLE with underlying hematoma.  Mild erythema but no purulent drainage or cellulitis present.  Cardiovascular: Normal rate and rhythm. S1,S2 noted.  No murmur, rubs or gallops noted.  Pulmonary/Chest: Normal effort and positive vesicular breath sounds. No respiratory distress. No wheezes, rales or ronchi noted.  Musculoskeletal: Normal flexion and extension of the right knee.  No difficulty with gait.  Neurological: Alert and oriented.   BMET    Component Value Date/Time   NA 143 03/02/2023 0802   K 4.0 03/02/2023 0802   CL 107 03/02/2023 0802   CO2 27 03/02/2023 0802   GLUCOSE 94 03/02/2023 0802   BUN 16 03/02/2023 0802   CREATININE 1.06 03/02/2023 0802   CALCIUM 9.3 03/02/2023 0802   GFRNONAA >60 03/22/2020 0701   GFRAA >60 03/22/2020 0701    Lipid Panel     Component Value Date/Time   CHOL 172 03/02/2023 0802   TRIG 108 03/02/2023 0802   HDL 52 03/02/2023 0802   CHOLHDL 3.3 03/02/2023 0802   LDLCALC 100 (H) 03/02/2023 0802    CBC    Component Value Date/Time   WBC 6.9 03/02/2023 0802   RBC 4.17 (L) 03/02/2023 0802   HGB 13.8 03/02/2023 0802   HCT 41.4 03/02/2023 0802   PLT 176 03/02/2023 0802   MCV 99.3 03/02/2023 0802   MCH 33.1 (H) 03/02/2023 0802   MCHC 33.3 03/02/2023 0802   RDW 12.2 03/02/2023 0802   LYMPHSABS 1,684 03/02/2023 0802   MONOABS 1.7 (H) 03/20/2020 0214   EOSABS 110 03/02/2023 0802   BASOSABS 28 03/02/2023 0802    Hgb A1C Lab Results  Component Value Date   HGBA1C 5.3 03/02/2023            Assessment & Plan:   Assessment and Plan    Puncture wound of RLE with leg hematoma Puncture wound from metal object causing hematoma. No infection signs but potential risk present. Hematoma may resolve in 2-3 months. - Prescribed  doxycycline 100 mg BID for 10 days in case underlying infection is present. - Advised warm compress BID. - Administered tetanus vaccination. - Instructed to monitor and report if worsens or no improvement in 6 weeks.   Follow-up with your PCP as previously scheduled Helayne Lo, NP

## 2024-05-10 ENCOUNTER — Encounter: Payer: Self-pay | Admitting: Family Medicine

## 2024-05-10 ENCOUNTER — Ambulatory Visit: Admitting: Family Medicine

## 2024-05-10 VITALS — BP 140/84 | HR 82 | Ht 70.0 in | Wt 205.2 lb

## 2024-05-10 DIAGNOSIS — R140 Abdominal distension (gaseous): Secondary | ICD-10-CM | POA: Diagnosis not present

## 2024-05-10 DIAGNOSIS — N529 Male erectile dysfunction, unspecified: Secondary | ICD-10-CM | POA: Diagnosis not present

## 2024-05-10 DIAGNOSIS — K529 Noninfective gastroenteritis and colitis, unspecified: Secondary | ICD-10-CM | POA: Diagnosis not present

## 2024-05-10 MED ORDER — DICYCLOMINE HCL 10 MG PO CAPS: 10.0000 mg | ORAL_CAPSULE | Freq: Three times a day (TID) | ORAL | 0 refills | Status: DC

## 2024-05-10 MED ORDER — TADALAFIL 20 MG PO TABS: ORAL_TABLET | ORAL | 5 refills | Status: AC

## 2024-05-10 NOTE — Progress Notes (Signed)
 Subjective:    Patient ID: Robert Suarez, male    DOB: [birth year], 58 y.o.   MRN: [id number]  Robert Suarez is a 58 y.o. male presenting on 05/10/2024 for Medical Management of Chronic Issues and Diarrhea   HPI  Discussed the use of AI scribe software for clinical note transcription with the patient, who gave verbal consent to proceed.  History of Present Illness   Robert Suarez is a 59 year old male who presents with chronic diarrhea and gastrointestinal symptoms.  Chronic postprandial diarrhea and altered bowel habits - Chronic postprandial diarrhea for the past two years with progressive worsening - Loose, semi-solid stools occurring predictably after meals, approximately two to three times daily - Increased intestinal gas and audible gas noises - No watery stools, hematochezia, or changes in stool color - No significant abdominal pain or cramping - Frequent bowel movements may aggravate internal hemorrhoids - No significant weight changes  Dietary factors - Regular consumption of artificial sweeteners, particularly in sugar-free drinks used to flavor water - Does not drink plain water - Frequent intake of snacks such as potato chips and popcorn and these still cause diarrhea  Gastrointestinal history and prior interventions - Colonoscopy in 2017 revealed a hyperplastic polyp - History of small, non-protruding internal hemorrhoids -  He did not try Imodium or Peppermint Oil or other therapy - GERD history Takes medication for gastric acid suppression,Pantoprazole  -  which has controlled upper gastrointestinal symptoms - No acid reflux or belching       Health Maintenance:  Colonoscopy done 07/30/16 by Dr Therisa AGI - 1 polyp   Past Surgical History:  Procedure Laterality Date   COLONOSCOPY WITH PROPOFOL  N/A 07/30/2016   Procedure: COLONOSCOPY WITH PROPOFOL ;  Surgeon: Ruel Therisa, MD;  Location: [facility] ENDOSCOPY;  Service: Endoscopy;  Laterality: N/A;    ESOPHAGOGASTRODUODENOSCOPY (EGD) WITH PROPOFOL  N/A 10/23/2019   Procedure: ESOPHAGOGASTRODUODENOSCOPY (EGD) WITH PROPOFOL ;  Surgeon: Jinny Carmine, MD;  Location: [facility] ENDOSCOPY;  Service: Endoscopy;  Laterality: N/A;   ligament repair Right    NO PAST SURGERIES          05/10/2024   10:48 AM 02/21/2024    2:15 PM 03/09/2023    8:34 AM  Depression screen PHQ 2/9  Decreased Interest 0 0 0  Down, Depressed, Hopeless 0 0 0  PHQ - 2 Score 0 0 0  Altered sleeping 0 0 0  Tired, decreased energy 0 0 0  Change in appetite 0 0 0  Feeling bad or failure about yourself  0 0 0  Trouble concentrating 0 0 0  Moving slowly or fidgety/restless 0 0 0  Suicidal thoughts 0 0 0  PHQ-9 Score 0 0 0  Difficult doing work/chores  Not difficult at all Not difficult at all       05/10/2024   10:48 AM 02/21/2024    2:15 PM 03/09/2023    8:34 AM 08/19/2022    3:24 PM  GAD 7 : Generalized Anxiety Score  Nervous, Anxious, on Edge 0 0 0 0  Control/stop worrying 0 0 0 0  Worry too much - different things 0 0 0 0  Trouble relaxing 0 0 0 0  Restless 0 0 0 0  Easily annoyed or irritable 0 0 0 0  Afraid - awful might happen 0 0 0 0  Total GAD 7 Score 0 0 0 0  Anxiety Difficulty  Not difficult at all Not difficult at all Not difficult at all    Social History  Tobacco Use   Smoking status: Never   Smokeless tobacco: Never  Vaping Use   Vaping status: Never Used  Substance Use Topics   Alcohol use: No   Drug use: No    Review of Systems Per HPI unless specifically indicated above     Objective:    BP (!) 140/84 (BP Location: Left Arm, Cuff Size: Normal)   Pulse 82   Ht 5' 10 (1.778 m)   Wt 205 lb 4 oz (93.1 kg)   SpO2 95%   BMI 29.45 kg/m   Wt Readings from Last 3 Encounters:  05/10/24 205 lb 4 oz (93.1 kg)  02/21/24 204 lb 9.6 oz (92.8 kg)  11/05/23 207 lb (93.9 kg)    Physical Exam Vitals and nursing note reviewed.  Constitutional:      General: He is not in acute distress.     Appearance: Normal appearance. He is well-developed. He is not diaphoretic.     Comments: Well-appearing, comfortable, cooperative  HENT:     Head: Normocephalic and atraumatic.  Eyes:     General:        Right eye: No discharge.        Left eye: No discharge.     Conjunctiva/sclera: Conjunctivae normal.  Cardiovascular:     Rate and Rhythm: Normal rate.  Pulmonary:     Effort: Pulmonary effort is normal.  Abdominal:     General: There is no distension.     Palpations: Abdomen is soft. There is no mass.     Tenderness: There is no abdominal tenderness. There is no guarding or rebound.     Comments: Some increased bowel sounds on exam, general distribution  Skin:    General: Skin is warm and dry.     Findings: No erythema or rash.  Neurological:     Mental Status: He is alert and oriented to person, place, and time.  Psychiatric:        Mood and Affect: Mood normal.        Behavior: Behavior normal.        Thought Content: Thought content normal.     Comments: Well groomed, good eye contact, normal speech and thoughts      Results for orders placed or performed in visit on 11/05/23  POCT Urinalysis Dipstick   Collection Time: 11/05/23 11:26 AM  Result Value Ref Range   Color, UA yellow    Clarity, UA cloudy    Glucose, UA Negative Negative   Bilirubin, UA negative    Ketones, UA trace    Spec Grav, UA 1.025 1.010 - 1.025   Blood, UA negative    pH, UA 6.0 5.0 - 8.0   Protein, UA Negative Negative   Urobilinogen, UA 0.2 0.2 or 1.0 E.U./dL   Nitrite, UA negative    Leukocytes, UA Negative Negative   Appearance     Odor positive       Assessment & Plan:   Problem List Items Addressed This Visit   None Visit Diagnoses       Postprandial diarrhea    -  Primary   Relevant Medications   dicyclomine  (BENTYL ) 10 MG capsule   Other Relevant Orders   Ambulatory referral to Gastroenterology     Postprandial bloating       Relevant Medications   dicyclomine  (BENTYL )  10 MG capsule   Other Relevant Orders   Ambulatory referral to Gastroenterology     Erectile dysfunction, unspecified erectile dysfunction type  Relevant Medications   tadalafil  (CIALIS ) 20 MG tablet       Chronic functional diarrhea with postprandial urgency Chronic diarrhea with postprandial urgency, for 2+ years or more duration with worsening now. More frequency Differential includes IBS. Previous colonoscopy showed non-cancerous hyperplastic polyp 2017, not due for repeat for another 2 years, 2027 Question likely exacerbated by artificial sweeteners.  Symptoms worsening, gastroenterology evaluation needed.  - Refer to gastroenterologist at [facility]. - Advise avoidance of artificial sweeteners, especially sorbitol. - Recommend over-the-counter remedy for bowel urgency - use peppermint oil (IB Guard) three times daily, adjust to once daily if improved. - Prescribe 30 pills of Dicyclomine  for bowel urgency, to be taken as needed before meals and at bedtime. Trial on this to see if effective, he has no cramping or pain so it may help reduce diarrhea urgency but we can adjust rx if it works well  Internal hemorrhoids Small internal hemorrhoids likely due to frequent bowel movements. Seems unrelated  Erectile dysfunction Request for Cialis  refill for erectile dysfunction. - Refill Cialis  prescription at [facility] pharmacy.        Orders Placed This Encounter  Procedures   Ambulatory referral to Gastroenterology    Referral Priority:   Routine    Referral Type:   Consultation    Referral Reason:   Specialty Services Required    Number of Visits Requested:   1    Meds ordered this encounter  Medications   dicyclomine  (BENTYL ) 10 MG capsule    Sig: Take 1 capsule (10 mg total) by mouth 3 (three) times daily before meals. For diarrhea urgency and bloating    Dispense:  30 capsule    Refill:  0   tadalafil  (CIALIS ) 20 MG tablet    Sig: TAKE 1 TABLET BY MOUTH EVERY  OTHER DAY AS NEEDED FOR ERECTILE DYSFUNCTION    Dispense:  30 tablet    Refill:  5    Follow up plan: Return if symptoms worsen or fail to improve.  Marsa Officer, DO [facility] 05/10/2024, 11:01 AM

## 2024-05-10 NOTE — Patient Instructions (Addendum)
 Thank you for coming to the office today.  Referral to GI for further evaluation and management, they may discuss repeat Colonoscopy sooner, you are not due technically until 2027  [facility] [address] [place], [place] [address] Hours: 8AM-5PM Phone: [phone]  In meantime while waiting on referral.  Check the food label and ingredients on all products that you consume that may involve sugar free components. Such as the water add on, gum mints candies etc. Look for Sorbitol, as this can cause diarrhea. Try to stop or change those products - Avoid artificial sweeteners  Add trial of herbal remedy OTC Peppermint Oil (Triple Coated Capsule) 180mg  take one 3 times daily to reduce diarrhea - This can reduce bowel urgency, some patients have Irritable Bowel Syndrome and they have frequent diarrhea. - eventually if doing better, can scale back to 1 x daily  Try rx Dicyclomine  for abdominal urgency cramping and bowel movements - it can be taken before meals as needed, up to 3 times per day, trial 30 pills see if this works and let me know I can order more.    Please schedule a Follow-up Appointment to: Return if symptoms worsen or fail to improve.  If you have any other questions or concerns, please feel free to call the office or send a message through MyChart. You may also schedule an earlier appointment if necessary.  Additionally, you may be receiving a survey about your experience at our office within a few days to 1 week by e-mail or mail. We value your feedback.  Marsa Officer, DO [facility], [place]

## 2024-07-20 ENCOUNTER — Ambulatory Visit: Admitting: Family Medicine

## 2024-07-20 ENCOUNTER — Ambulatory Visit: Payer: Self-pay | Admitting: Family Medicine

## 2024-07-20 ENCOUNTER — Ambulatory Visit
Admission: RE | Admit: 2024-07-20 | Discharge: 2024-07-20 | Disposition: A | Discharge status: Home / Self Care | Source: Ambulatory Visit | Attending: Family Medicine | Admitting: Family Medicine

## 2024-07-20 ENCOUNTER — Encounter: Payer: Self-pay | Admitting: Family Medicine

## 2024-07-20 VITALS — BP 158/94 | HR 83 | Ht 70.0 in | Wt 207.2 lb

## 2024-07-20 DIAGNOSIS — M25561 Pain in right knee: Secondary | ICD-10-CM | POA: Diagnosis not present

## 2024-07-20 DIAGNOSIS — G8929 Other chronic pain: Secondary | ICD-10-CM | POA: Insufficient documentation

## 2024-07-20 DIAGNOSIS — M1711 Unilateral primary osteoarthritis, right knee: Secondary | ICD-10-CM | POA: Diagnosis not present

## 2024-07-20 DIAGNOSIS — Z8739 Personal history of other diseases of the musculoskeletal system and connective tissue: Secondary | ICD-10-CM

## 2024-07-20 DIAGNOSIS — M25461 Effusion, right knee: Secondary | ICD-10-CM

## 2024-07-20 LAB — COMPREHENSIVE METABOLIC PANEL WITH GFR
AG Ratio: 2.3 (calc) (ref 1.0–2.5)
ALT: 15 U/L (ref 9–46)
AST: 17 U/L (ref 10–35)
Albumin: 4.5 g/dL (ref 3.6–5.1)
Alkaline phosphatase (APISO): 58 U/L (ref 35–144)
BUN: 13 mg/dL (ref 7–25)
CO2: 28 mmol/L (ref 20–32)
Calcium: 9.2 mg/dL (ref 8.6–10.3)
Chloride: 106 mmol/L (ref 98–110)
Creat: 1 mg/dL (ref 0.70–1.30)
Globulin: 2 g/dL (ref 1.9–3.7)
Glucose, Bld: 91 mg/dL (ref 65–99)
Potassium: 4.1 mmol/L (ref 3.5–5.3)
Sodium: 142 mmol/L (ref 135–146)
Total Bilirubin: 0.7 mg/dL (ref 0.2–1.2)
Total Protein: 6.5 g/dL (ref 6.1–8.1)
eGFR: 87 mL/min/1.73m2 (ref >=60)

## 2024-07-20 LAB — URIC ACID: Uric Acid, Serum: 6.6 mg/dL (ref 4.0–8.0)

## 2024-07-20 MED ORDER — NAPROXEN 500 MG PO TABS
500.0000 mg | ORAL_TABLET | Freq: Two times a day (BID) | ORAL | 2 refills | Status: AC
Start: 2024-07-20 — End: ?

## 2024-07-20 NOTE — Patient Instructions (Addendum)
 Thank you for coming to the office today.  Right Knee   Most likely Arthritis flaring up episodic, but could be possible for gout  We checked the lab 2 years ago and it was mild, but we can re-check now that you are having a flare.  X-ray can help us  understand arthritis diagnosis or other injury  For the pain and swelling  START anti inflammatory topical - OTC Voltaren (generic Diclofenac) topical 2-4 times a day as needed for pain swelling of affected joint for 1-2 weeks or longer.  Use RICE therapy: - R - Rest / relative rest with activity modification avoid overuse of joint - I - Ice packs (make sure you use a towel or sock / something to protect skin) - C - Compression with flexible Knee Sleeve / ACE wrap to apply pressure and reduce swelling allowing more support - E - Elevation - if significant swelling, lift leg above heart level (toes above your nose) to help reduce swelling, most helpful at night after day of being on your feet  Recommend to start taking Tylenol  Extra Strength 500mg  tabs - take 1 to 2 tabs per dose (max 1000mg ) every 6-8 hours for pain (take regularly, don't skip a dose for next 7 days), max 24 hour daily dose is 6 tablets or 3000mg . In the future you can repeat the same everyday Tylenol  course for 1-2 weeks at a time.    Please schedule a Follow-up Appointment to: Return if symptoms worsen or fail to improve.  If you have any other questions or concerns, please feel free to call the office or send a message through MyChart. You may also schedule an earlier appointment if necessary.  Additionally, you may be receiving a survey about your experience at our office within a few days to 1 week by e-mail or mail. We value your feedback.  Marsa Officer, DO [facility], [place]

## 2024-07-20 NOTE — Progress Notes (Unsigned)
 Subjective:    Patient ID: Robert Suarez, male    DOB: Dec 21, 1965, 58 y.o.   MRN: [id number]  Yoseph Haile is a 58 y.o. male presenting on 07/20/2024 for Knee Pain   HPI  Discussed the use of AI scribe software for clinical note transcription with the patient, who gave verbal consent to proceed.  History of Present Illness   Robert Suarez is a 58 year old male who presents with right knee pain and swelling. He is accompanied by his daughter.  Right knee pain and swelling - Right knee pain and swelling present for approximately one year - Pain described as shooting and exacerbated by cold weather - Knee instability, especially when standing or walking at work, with occasional sensation of possible collapse - Pain occasionally radiates to the toe - Current episode has lasted about two weeks, with associated warmth and swelling - No extreme pain or inability to bear weight - Sensation of vibration in the leg, sometimes confused with phone vibration - Pain can be severe enough to prevent working, as occurred last night - Manages pain with Tylenol  500 mg as needed and uses a towel to keep the knee warm - No recent use of knee brace or sleeve  Gout and uric acid levels - Previously Uric acid level of 6.7 in 2023, suggesting mild gout possibility - No recent acute gout flare reported  Musculoskeletal history - History of back issues associated with occasional leg pain - Previous x-ray of the leg showed no significant abnormalities  Occupational and environmental factors - Works night shifts in a cold environment, which may exacerbate knee symptoms          07/20/2024    8:12 AM 05/10/2024   10:48 AM 02/21/2024    2:15 PM  Depression screen PHQ 2/9  Decreased Interest 0 0 0  Down, Depressed, Hopeless 0 0 0  PHQ - 2 Score 0 0 0  Altered sleeping  0 0  Tired, decreased energy  0 0  Change in appetite  0 0  Feeling bad or failure about yourself   0 0  Trouble concentrating  0 0   Moving slowly or fidgety/restless  0 0  Suicidal thoughts  0 0  PHQ-9 Score  0 0  Difficult doing work/chores   Not difficult at all       07/20/2024    8:12 AM 05/10/2024   10:48 AM 02/21/2024    2:15 PM 03/09/2023    8:34 AM  GAD 7 : Generalized Anxiety Score  Nervous, Anxious, on Edge 0 0 0 0  Control/stop worrying 0 0 0 0  Worry too much - different things 0 0 0 0  Trouble relaxing 0 0 0 0  Restless 0 0 0 0  Easily annoyed or irritable 0 0 0 0  Afraid - awful might happen 0 0 0 0  Total GAD 7 Score 0 0 0 0  Anxiety Difficulty   Not difficult at all Not difficult at all    Social History   Tobacco Use   Smoking status: Never   Smokeless tobacco: Never  Vaping Use   Vaping status: Never Used  Substance Use Topics   Alcohol use: No   Drug use: No    Review of Systems Per HPI unless specifically indicated above     Objective:    BP (!) 142/90 (BP Location: Right Arm, Patient Position: Sitting, Cuff Size: Normal)   Pulse 83   Ht 5' 10 (  1.778 m)   Wt 207 lb 4 oz (94 kg)   SpO2 95%   BMI 29.74 kg/m   Wt Readings from Last 3 Encounters:  07/20/24 207 lb 4 oz (94 kg)  05/10/24 205 lb 4 oz (93.1 kg)  02/21/24 204 lb 9.6 oz (92.8 kg)    Physical Exam Vitals and nursing note reviewed.  Constitutional:      General: He is not in acute distress.    Appearance: Normal appearance. He is well-developed. He is not diaphoretic.     Comments: Well-appearing, comfortable, cooperative  HENT:     Head: Normocephalic and atraumatic.  Eyes:     General:        Right eye: No discharge.        Left eye: No discharge.     Conjunctiva/sclera: Conjunctivae normal.  Cardiovascular:     Rate and Rhythm: Normal rate.  Pulmonary:     Effort: Pulmonary effort is normal.  Musculoskeletal:     Comments: Right Knee Inspection: some bulky appearance with effusion R knee Palpation: Mild tender medial joint line. Crepitus ROM: Full active ROM bilaterally Special Testing: Lachman  / Valgus/Varus tests negative with intact ligaments (ACL, MCL, LCL). Strength: 5/5 intact knee flex/ext, ankle dorsi/plantarflex Neurovascular: distally intact sensation light touch and pulses   Skin:    General: Skin is warm and dry.     Findings: No erythema or rash.  Neurological:     Mental Status: He is alert and oriented to person, place, and time.  Psychiatric:        Mood and Affect: Mood normal.        Behavior: Behavior normal.        Thought Content: Thought content normal.     Comments: Well groomed, good eye contact, normal speech and thoughts    I have personally reviewed the radiology report from 07/20/24 R Knee X-ray.  Narrative & Impression  EXAM: 4 VIEW(S) XRAY OF THE RIGHT KNEE 07/20/2024 09:27:38 AM   COMPARISON: None available.   CLINICAL HISTORY: Chronic right knee pain swelling, eval arthritis vs gout   FINDINGS:   BONES AND JOINTS: No acute fracture. No focal osseous lesion. No joint dislocation. Moderate medial tibiofemoral joint space narrowing. Mild tricompartmental peripheral spurring. Trace joint effusion. Small quadriceps tendon enthesophyte.   SOFT TISSUES: The soft tissues are unremarkable.   IMPRESSION: 1. Moderate medial tibiofemoral joint space narrowing and mild tricompartmental peripheral spurring, consistent with degenerative changes. 2. Trace joint effusion. 3. Small quadriceps tendon enthesophyte.   Electronically signed by: Norleen Boxer MD 07/20/2024 10:31 AM EST RP Workstation: [id number]    Results for orders placed or performed in visit on 11/05/23  POCT Urinalysis Dipstick   Collection Time: 11/05/23 11:26 AM  Result Value Ref Range   Color, UA yellow    Clarity, UA cloudy    Glucose, UA Negative Negative   Bilirubin, UA negative    Ketones, UA trace    Spec Grav, UA 1.025 1.010 - 1.025   Blood, UA negative    pH, UA 6.0 5.0 - 8.0   Protein, UA Negative Negative   Urobilinogen, UA 0.2 0.2 or 1.0 E.U./dL    Nitrite, UA negative    Leukocytes, UA Negative Negative   Appearance     Odor positive       Assessment & Plan:   Problem List Items Addressed This Visit   None Visit Diagnoses       Chronic pain of right knee    -  Primary   Relevant Medications   naproxen  (NAPROSYN ) 500 MG tablet   Other Relevant Orders   Comprehensive metabolic panel with GFR   DG Knee Complete 4 Views Right     Pain and swelling of right knee       Relevant Medications   naproxen  (NAPROSYN ) 500 MG tablet   Other Relevant Orders   Uric acid   DG Knee Complete 4 Views Right     Primary osteoarthritis of right knee       Relevant Medications   naproxen  (NAPROSYN ) 500 MG tablet   Other Relevant Orders   DG Knee Complete 4 Views Right     History of gout       Relevant Orders   Uric acid   Comprehensive metabolic panel with GFR         Right knee pain and swelling, possible osteoarthritis versus gout Chronic knee pain and swelling with possible osteoarthritis or gout. Previous uric acid level suggests mild gout. Symptoms include warmth, swelling, and occasional popping. Outdated x-ray. - Ordered right knee x-ray for arthritis assessment. - Ordered repeat uric acid level for gout evaluation. If abnormal consider colchicine PRN - Recommended topical Voltaren gel up to four times daily. - Prescribed naproxen  twice daily with meals for up to two weeks. - Advised knee compression sleeve for support. - Instructed Tylenol  1000 mg three times daily, max 3000 mg in 24 hours.  Elevated BP without htn Blood pressure elevated at 142/90, possibly due to pain. Previous management with olmesartan . Discussed impact of pain and medications on blood pressure. - Monitor blood pressure, reassess in one month. - Advised low sodium and low caffeine diet.         Orders Placed This Encounter  Procedures   DG Knee Complete 4 Views Right    Standing Status:   Future    Expiration Date:   07/20/2025    Reason for  Exam (SYMPTOM  OR DIAGNOSIS REQUIRED):   Chronic right knee pain swelling, eval arthritis vs gout    Preferred imaging location?:   [facility]   Uric acid   Comprehensive metabolic panel with GFR    Meds ordered this encounter  Medications   naproxen  (NAPROSYN ) 500 MG tablet    Sig: Take 1 tablet (500 mg total) by mouth 2 (two) times daily with a meal. For 1-2 weeks then as needed    Dispense:  60 tablet    Refill:  2    Follow up plan: Return if symptoms worsen or fail to improve.   Marsa Officer, DO [facility] 07/20/2024, 8:36 AM

## 2024-07-21 NOTE — Telephone Encounter (Signed)
 Copied from CRM [id number]. Topic: General - Other >> Jul 21, 2024  8:22 AM Robinson H wrote: Reason for CRM: Patient returned call to office, agent advised patient as stated from provider, patient acknowledged

## 2024-08-29 ENCOUNTER — Encounter: Payer: Self-pay | Admitting: Emergency Medicine

## 2024-08-29 ENCOUNTER — Other Ambulatory Visit: Payer: Self-pay

## 2024-08-29 ENCOUNTER — Emergency Department
Admission: EM | Admit: 2024-08-29 | Discharge: 2024-08-29 | Disposition: A | Discharge status: Home / Self Care | Source: Home / Self Care | Attending: Emergency Medicine | Admitting: Emergency Medicine

## 2024-08-29 DIAGNOSIS — X58XXXA Exposure to other specified factors, initial encounter: Secondary | ICD-10-CM | POA: Diagnosis not present

## 2024-08-29 DIAGNOSIS — T18128A Food in esophagus causing other injury, initial encounter: Secondary | ICD-10-CM | POA: Diagnosis not present

## 2024-08-29 DIAGNOSIS — W44F3XA Food entering into or through a natural orifice, initial encounter: Secondary | ICD-10-CM

## 2024-08-29 DIAGNOSIS — I10 Essential (primary) hypertension: Secondary | ICD-10-CM

## 2024-08-29 DIAGNOSIS — K222 Esophageal obstruction: Secondary | ICD-10-CM | POA: Diagnosis not present

## 2024-08-29 MED ORDER — IRBESARTAN 150 MG PO TABS
300.0000 mg | ORAL_TABLET | Freq: Every day | ORAL | Status: DC
  Administered 2024-08-29: 12:00:00 300 mg via ORAL
  Filled 2024-08-29: qty 2

## 2024-08-29 NOTE — ED Provider Notes (Signed)
 [facility] Provider Note    Event Date/Time   First MD Initiated Contact with Patient 08/29/24 1038     (approximate)   History   food bolus   HPI  Camari Wisham is a 58 y.o. male  with history of GERD, HTN,  and as listed in EMR presents to the emergency department for evaluation after food bolus. He had eaten grits and sausage after taking his reflux medicine, but felt like it all got stuck in his throat. He couldn't swallow but wasn't having any difficulty breathing. He coughed up what appeared to be phlegm and some came through his nose as well. He now feels back to normal.   Physical Exam    Vitals:   08/29/24 1042 08/29/24 1153  BP: (!) 189/120 (!) 179/110  Pulse: 80 79  Resp: 18 18  Temp: 98.6 F (37 C)   SpO2: 98%     General: Awake, no distress.  CV:  Good peripheral perfusion.  Resp:  Normal effort.  Abd:  No distention.  Other:  Voice is strong, speech is clear.   ED Results / Procedures / Treatments   Labs (all labs ordered are listed, but only abnormal results are displayed)  Labs Reviewed - No data to display   EKG  Not indicated.   RADIOLOGY  Image and radiology report reviewed and interpreted by me. Radiology report consistent with the same.  Not indicated.  PROCEDURES:  Critical Care performed: No  Procedures   MEDICATIONS ORDERED IN ED:  Medications  irbesartan  (AVAPRO ) tablet 300 mg (300 mg Oral Given 08/29/24 1131)     IMPRESSION / MDM / ASSESSMENT AND PLAN / ED COURSE   I have reviewed the triage note and vital signs. Vital signs: hypertension   Differential diagnosis includes, but is not limited to, uncontrolled hypertension, food bolus, esophageal stricture  Patient's presentation is most consistent with acute presentation with potential threat to life or bodily function.  Patient reports that he has been taking his blood pressure medication about every other day because each time he took  it it feels like it gets stuck in his esophagus and that sensation will last even through the night.  He states that he works in a very cold environment and has had sensation that medications and food has been getting stuck due to chest congestion.  After coughing up a significant amount of mucus and phlegm this morning, this sensation has completely resolved.  Plan will be to give him his olmesartan  dose while here to ensure that it goes down easily.  He has been tolerating water since being here.   Irbesartan  was substituted for Benicar  as the Benicar  is not currently on hospital formulary.  Patient was able to swallow medication and fluids without sensation of bolus.  Patient was encouraged to take his medication daily.  Long-term adverse effects of hypertension and potential deadly event discussed. He verbalized understanding and plans to take it daily as prescribed. He reports being scheduled for endoscopy in the near future. He was encouraged to request his 40mg  olmesartan  be broken down into two 20 mg tablets in hopes that that might make them smaller for him to swallow comfortably.  He is comfortable with plan of discharge.  ER return precautions given.     FINAL CLINICAL IMPRESSION(S) / ED DIAGNOSES   Final diagnoses:  Esophageal obstruction due to food impaction  Uncontrolled hypertension     Rx / DC Orders   ED Discharge  Orders     None        Note:  This document was prepared using Dragon voice recognition software and may include unintentional dictation errors.   Herlinda Kirk NOVAK, FNP 08/29/24 1403    Willo Dunnings, MD 08/29/24 [phone]

## 2024-08-29 NOTE — ED Triage Notes (Signed)
 Presents from home  States she felt like something was stuck  Was able to cough it up  States he feels better

## 2024-08-29 NOTE — Discharge Instructions (Addendum)
 Get in touch with your primary care provider and request that your medications be divided in two smaller tablets.  Keep your scheduled appointment with gastroenterology for your endoscopy procedure.  Return to the emergency department for symptoms of change or worsen if you are unable to schedule appointment.

## 2024-08-31 ENCOUNTER — Encounter: Admitting: Family Medicine

## 2024-09-27 ENCOUNTER — Encounter: Payer: Self-pay | Admitting: Family Medicine

## 2024-09-27 ENCOUNTER — Ambulatory Visit: Admitting: Family Medicine

## 2024-09-27 VITALS — BP 150/90 | HR 95 | Ht 70.0 in | Wt 205.1 lb

## 2024-09-27 DIAGNOSIS — L209 Atopic dermatitis, unspecified: Secondary | ICD-10-CM | POA: Diagnosis not present

## 2024-09-27 DIAGNOSIS — G8929 Other chronic pain: Secondary | ICD-10-CM | POA: Diagnosis not present

## 2024-09-27 DIAGNOSIS — M25461 Effusion, right knee: Secondary | ICD-10-CM

## 2024-09-27 DIAGNOSIS — I10 Essential (primary) hypertension: Secondary | ICD-10-CM

## 2024-09-27 DIAGNOSIS — M1711 Unilateral primary osteoarthritis, right knee: Secondary | ICD-10-CM | POA: Diagnosis not present

## 2024-09-27 DIAGNOSIS — M25561 Pain in right knee: Secondary | ICD-10-CM | POA: Diagnosis not present

## 2024-09-27 MED ORDER — CLOTRIMAZOLE-BETAMETHASONE 1-0.05 % EX CREA: 1.0000 | TOPICAL_CREAM | Freq: Every day | CUTANEOUS | 0 refills | Status: AC

## 2024-09-27 NOTE — Progress Notes (Signed)
 "  Subjective:    Patient ID: Robert Suarez, male    DOB: 1966/04/20, 59 y.o.   MRN: [id number]  Robert Suarez is a 59 y.o. male presenting on 09/27/2024 for Knee Pain (Right knee )   HPI  Discussed the use of AI scribe software for clinical note transcription with the patient, who gave verbal consent to proceed.  History of Present Illness   Robert Suarez is a 59 year old male with advanced arthritis who presents with worsening knee pain and instability.  Right Knee Pain, Osteoarthritis Knee pain and instability - Advanced arthritis with worsening knee pain and instability since November - Sensation of 'bone on bone', popping sounds, and 'clunk' or 'slipping' in the knee - Pain radiates down the leg, causing occasional stumbling - Pain exacerbated by inactivity, particularly at night, resulting in sleep disturbance and fatigue at work - Symptoms worsened by cold weather but persist even without significant cold exposure - No significant relief from anti-inflammatories, topical agents, or compression sleeve - Previous knee x-ray performed  Cutaneous rash - Recurring pruritic and xerotic rash on stomach, back, and arms - Previously used a prescribed cream, believed to be antifungal, with uncertain effectiveness - No vesicular lesions or localized pain; rash does not resemble shingles  Recent respiratory episode - One month ago, experienced acute dyspnea associated with excessive phlegm and mucus - Required ambulance transport to hospital - Treated with a blood pressure medication; no further respiratory evaluation performed - No current respiratory symptoms     Hypertension Elevated BP recently with knee pain.      07/20/2024    8:12 AM 05/10/2024   10:48 AM 02/21/2024    2:15 PM  Depression screen PHQ 2/9  Decreased Interest 0 0 0  Down, Depressed, Hopeless 0 0 0  PHQ - 2 Score 0 0 0  Altered sleeping  0 0  Tired, decreased energy  0 0  Change in appetite  0 0  Feeling bad  or failure about yourself   0 0  Trouble concentrating  0 0  Moving slowly or fidgety/restless  0 0  Suicidal thoughts  0 0  PHQ-9 Score  0  0   Difficult doing work/chores   Not difficult at all     Data saved with a previous flowsheet row definition       07/20/2024    8:12 AM 05/10/2024   10:48 AM 02/21/2024    2:15 PM 03/09/2023    8:34 AM  GAD 7 : Generalized Anxiety Score  Nervous, Anxious, on Edge 0 0 0 0  Control/stop worrying 0 0 0 0  Worry too much - different things 0 0 0 0  Trouble relaxing 0 0 0 0  Restless 0 0 0 0  Easily annoyed or irritable 0 0 0 0  Afraid - awful might happen 0 0 0 0  Total GAD 7 Score 0 0 0 0  Anxiety Difficulty   Not difficult at all Not difficult at all    Social History[1]  Review of Systems Per HPI unless specifically indicated above     Objective:    BP (!) 150/90 (BP Location: Left Arm, Cuff Size: Normal)   Pulse 95   Ht 5' 10 (1.778 m)   Wt 205 lb 2 oz (93 kg)   SpO2 95%   BMI 29.43 kg/m   Wt Readings from Last 3 Encounters:  09/27/24 205 lb 2 oz (93 kg)  08/29/24 207 lb 3.7 oz (94  kg)  07/20/24 207 lb 4 oz (94 kg)    Physical Exam Vitals and nursing note reviewed.  Constitutional:      General: He is not in acute distress.    Appearance: Normal appearance. He is well-developed. He is not diaphoretic.     Comments: Well-appearing, comfortable, cooperative  HENT:     Head: Normocephalic and atraumatic.  Eyes:     General:        Right eye: No discharge.        Left eye: No discharge.     Conjunctiva/sclera: Conjunctivae normal.  Cardiovascular:     Rate and Rhythm: Normal rate.  Pulmonary:     Effort: Pulmonary effort is normal.  Musculoskeletal:     Comments: Right Knee Inspection: Effusion medial Palpation: Mild tender joint line. Crepitus ROM: Full active ROM bilaterally Special Testing: Lachman / Valgus/Varus tests negative with intact ligaments (ACL, MCL, LCL). Strength: 5/5 intact knee flex/ext, ankle  dorsi/plantarflex Neurovascular: distally intact sensation light touch and pulses   Skin:    General: Skin is warm and dry.     Findings: Rash (dry skin dermatitis eczematous patch left lower ankle) present. No erythema.  Neurological:     Mental Status: He is alert and oriented to person, place, and time.  Psychiatric:        Mood and Affect: Mood normal.        Behavior: Behavior normal.        Thought Content: Thought content normal.     Comments: Well groomed, good eye contact, normal speech and thoughts      I have personally reviewed the radiology report from 07/20/24 on R Knee.  DG Knee Complete 4 Views Right Order: [id number] Details  Reading Physician Reading Date Result Priority  Malva PARAS, MD [phone]  07/20/2024    Narrative & Impression EXAM: 4 VIEW(S) XRAY OF THE RIGHT KNEE 07/20/2024 09:27:38 AM   COMPARISON: None available.   CLINICAL HISTORY: Chronic right knee pain swelling, eval arthritis vs gout   FINDINGS:   BONES AND JOINTS: No acute fracture. No focal osseous lesion. No joint dislocation. Moderate medial tibiofemoral joint space narrowing. Mild tricompartmental peripheral spurring. Trace joint effusion. Small quadriceps tendon enthesophyte.   SOFT TISSUES: The soft tissues are unremarkable.   IMPRESSION: 1. Moderate medial tibiofemoral joint space narrowing and mild tricompartmental peripheral spurring, consistent with degenerative changes. 2. Trace joint effusion. 3. Small quadriceps tendon enthesophyte.   Electronically signed by: Norleen Malva MD 07/20/2024 10:31 AM EST RP Workstation: [id number]    Results for orders placed or performed in visit on 07/20/24  Uric acid   Collection Time: 07/20/24  9:07 AM  Result Value Ref Range   Uric Acid, Serum 6.6 4.0 - 8.0 mg/dL  Comprehensive metabolic panel with GFR   Collection Time: 07/20/24  9:07 AM  Result Value Ref Range   Glucose, Bld 91 65 - 99 mg/dL   BUN 13 7 -  25 mg/dL   Creat 8.99 9.29 - 8.69 mg/dL   eGFR 87 > OR = 60 fO/fpw/8.26f7   BUN/Creatinine Ratio SEE NOTE: 6 - 22 (calc)   Sodium 142 135 - 146 mmol/L   Potassium 4.1 3.5 - 5.3 mmol/L   Chloride 106 98 - 110 mmol/L   CO2 28 20 - 32 mmol/L   Calcium 9.2 8.6 - 10.3 mg/dL   Total Protein 6.5 6.1 - 8.1 g/dL   Albumin 4.5 3.6 - 5.1 g/dL   Globulin 2.0 1.9 -  3.7 g/dL (calc)   AG Ratio 2.3 1.0 - 2.5 (calc)   Total Bilirubin 0.7 0.2 - 1.2 mg/dL   Alkaline phosphatase (APISO) 58 35 - 144 U/L   AST 17 10 - 35 U/L   ALT 15 9 - 46 U/L      Assessment & Plan:   Problem List Items Addressed This Visit     Essential hypertension   Other Visit Diagnoses       Chronic pain of right knee    -  Primary   Relevant Orders   Ambulatory referral to Physical Therapy     Pain and swelling of right knee       Relevant Orders   Ambulatory referral to Physical Therapy     Primary osteoarthritis of right knee       Relevant Orders   Ambulatory referral to Physical Therapy     Atopic dermatitis, unspecified type       Relevant Medications   clotrimazole -betamethasone  (LOTRISONE ) cream        Primary osteoarthritis of right knee Advanced osteoarthritis with instability and pain. X-ray shows structural issues. Differential includes possible meniscus injury. Prefers non-invasive treatment. Continue current therapy topical Voltaren, naproxen  AS NEEDED, knee sleeve/support - Referred to physical therapy at [facility] in [place]. - Instructed to call back in 2-4 weeks after physical therapy to report progress. - If physical therapy is ineffective, will order MRI of the knee. - Will discuss potential referral to sports medicine specialist if needed.  Atopic dermatitis Intermittent dry, itchy rash. Previous topical treatment ineffective. Differential includes possible fungal component. - Prescribed new topical cream with stronger steroid and antifungal. - Instructed to apply cream once daily for  7-10 days.      Hypertension Elevated BP likely due to knee pain Olmesartan  40mg  daily    Orders Placed This Encounter  Procedures   Ambulatory referral to Physical Therapy    Referral Priority:   Routine    Referral Type:   Physical Medicine    Referral Reason:   Specialty Services Required    Requested Specialty:   Physical Therapy    Number of Visits Requested:   1    Meds ordered this encounter  Medications   clotrimazole -betamethasone  (LOTRISONE ) cream    Sig: Apply 1 Application topically daily. For 1 to 2 weeks at a time, may repeat if needed    Dispense:  30 g    Refill:  0    Follow up plan: Return if symptoms worsen or fail to improve.    Marsa Officer, DO [facility] 09/27/2024, 10:44 AM     [1]  Social History Tobacco Use   Smoking status: Never   Smokeless tobacco: Never  Vaping Use   Vaping status: Never Used  Substance Use Topics   Alcohol use: No   Drug use: No   "

## 2024-09-27 NOTE — Patient Instructions (Addendum)
 Thank you for coming to the office today.  Try the new rx Lotrisone  for the skin rash. Use it daily as needed for 1-2 weeks at a time.  Also use daily moisturizer as needed.  You have moderate to advanced arthritis wear and tear in the Right knee with swelling and possible meniscus injury.  Try the therapy first.  [facility] Physical therapist in [place], [place]  Address: [facility], [address], [place], [place] [address] Phone: [phone]  Please call me within next 2-4 weeks for an update, let me know if you are improving with therapy or if you still want the MRI.  Please schedule a Follow-up Appointment to: Return if symptoms worsen or fail to improve.  If you have any other questions or concerns, please feel free to call the office or send a message through MyChart. You may also schedule an earlier appointment if necessary.  Additionally, you may be receiving a survey about your experience at our office within a few days to 1 week by e-mail or mail. We value your feedback.  Marsa Officer, DO [facility], [place]

## 2024-10-16 ENCOUNTER — Ambulatory Visit: Admitting: Physical Therapy

## 2024-10-23 ENCOUNTER — Ambulatory Visit: Admitting: Physical Therapy

## 2024-10-25 ENCOUNTER — Ambulatory Visit: Admitting: Physical Therapy

## 2024-10-30 ENCOUNTER — Ambulatory Visit: Admitting: Physical Therapy

## 2024-11-01 ENCOUNTER — Ambulatory Visit: Admitting: Physical Therapy

## 2024-11-06 ENCOUNTER — Ambulatory Visit: Admitting: Physical Therapy

## 2024-11-08 ENCOUNTER — Ambulatory Visit: Admitting: Physical Therapy

## 2024-11-13 ENCOUNTER — Ambulatory Visit: Admitting: Physical Therapy

## 2024-11-15 ENCOUNTER — Ambulatory Visit: Admitting: Physical Therapy

## 2024-11-20 ENCOUNTER — Ambulatory Visit: Admitting: Physical Therapy

## 2024-11-22 ENCOUNTER — Ambulatory Visit: Admitting: Physical Therapy

## 2024-11-27 ENCOUNTER — Ambulatory Visit: Admitting: Physical Therapy

## 2024-11-29 ENCOUNTER — Ambulatory Visit: Admitting: Physical Therapy
